# Patient Record
Sex: Female | Born: 1975 | Race: White | Hispanic: No | Smoking: Former smoker
Health system: Southern US, Community
[De-identification: ages and names within clinical notes are randomized; demographics above are authoritative.]

## PROBLEM LIST (undated history)

## (undated) DIAGNOSIS — J45909 Unspecified asthma, uncomplicated: Secondary | ICD-10-CM

## (undated) DIAGNOSIS — I10 Essential (primary) hypertension: Secondary | ICD-10-CM

---

## 2005-09-20 ENCOUNTER — Observation Stay: Payer: Self-pay | Admitting: Obstetrics and Gynecology

## 2005-10-12 ENCOUNTER — Inpatient Hospital Stay: Payer: Self-pay

## 2005-12-06 ENCOUNTER — Ambulatory Visit: Payer: Self-pay | Admitting: Obstetrics and Gynecology

## 2005-12-31 ENCOUNTER — Emergency Department: Payer: Self-pay | Admitting: Emergency Medicine

## 2006-03-06 ENCOUNTER — Ambulatory Visit: Payer: Self-pay | Admitting: Unknown Physician Specialty

## 2006-04-05 ENCOUNTER — Ambulatory Visit: Payer: Self-pay | Admitting: Unknown Physician Specialty

## 2006-05-06 ENCOUNTER — Ambulatory Visit: Payer: Self-pay | Admitting: Unknown Physician Specialty

## 2010-07-06 ENCOUNTER — Emergency Department: Payer: Self-pay | Admitting: Unknown Physician Specialty

## 2010-09-25 ENCOUNTER — Ambulatory Visit: Payer: Self-pay

## 2010-10-09 ENCOUNTER — Ambulatory Visit: Payer: Self-pay

## 2011-01-09 ENCOUNTER — Ambulatory Visit: Payer: Self-pay | Admitting: Surgery

## 2011-02-20 ENCOUNTER — Ambulatory Visit: Payer: Self-pay | Admitting: Surgery

## 2011-02-21 LAB — PATHOLOGY REPORT

## 2011-09-24 ENCOUNTER — Ambulatory Visit: Payer: Self-pay | Admitting: Physician Assistant

## 2011-09-26 ENCOUNTER — Ambulatory Visit: Payer: Self-pay | Admitting: Surgery

## 2012-10-16 ENCOUNTER — Inpatient Hospital Stay: Payer: Self-pay | Admitting: Psychiatry

## 2012-10-16 LAB — TSH: Thyroid Stimulating Horm: 1.41 u[IU]/mL

## 2012-10-16 LAB — COMPREHENSIVE METABOLIC PANEL
Albumin: 4.4 g/dL (ref 3.4–5.0)
Anion Gap: 6 — ABNORMAL LOW (ref 7–16)
BUN: 9 mg/dL (ref 7–18)
Calcium, Total: 8.9 mg/dL (ref 8.5–10.1)
Co2: 27 mmol/L (ref 21–32)
Creatinine: 0.98 mg/dL (ref 0.60–1.30)
Osmolality: 267 (ref 275–301)
Sodium: 135 mmol/L — ABNORMAL LOW (ref 136–145)

## 2012-10-16 LAB — CBC
HGB: 15.1 g/dL (ref 12.0–16.0)
MCH: 29.4 pg (ref 26.0–34.0)
MCHC: 34.3 g/dL (ref 32.0–36.0)
MCV: 86 fL (ref 80–100)
Platelet: 223 10*3/uL (ref 150–440)
RBC: 5.15 10*6/uL (ref 3.80–5.20)
RDW: 14.3 % (ref 11.5–14.5)

## 2012-10-16 LAB — DRUG SCREEN, URINE
Benzodiazepine, Ur Scrn: NEGATIVE (ref ?–200)
Cannabinoid 50 Ng, Ur ~~LOC~~: NEGATIVE (ref ?–50)
Cocaine Metabolite,Ur ~~LOC~~: NEGATIVE (ref ?–300)
MDMA (Ecstasy)Ur Screen: POSITIVE (ref ?–500)
Methadone, Ur Screen: NEGATIVE (ref ?–300)
Opiate, Ur Screen: POSITIVE (ref ?–300)

## 2012-10-16 LAB — URINALYSIS, COMPLETE
Bilirubin,UR: NEGATIVE
Glucose,UR: NEGATIVE mg/dL (ref 0–75)
Ph: 7 (ref 4.5–8.0)
Protein: NEGATIVE
Specific Gravity: 1.005 (ref 1.003–1.030)

## 2012-10-21 ENCOUNTER — Ambulatory Visit: Payer: Self-pay | Admitting: Psychiatry

## 2012-11-04 ENCOUNTER — Ambulatory Visit: Payer: Self-pay | Admitting: Psychiatry

## 2012-11-15 ENCOUNTER — Ambulatory Visit: Payer: Self-pay | Admitting: Internal Medicine

## 2014-02-12 IMAGING — CT CT ABD-PELV W/O CM
1 of 2 series · 15 of 32 positions shown, 19 images · non-contrast
Comparison: none

REASON FOR EXAM: CR [id number] severe rt flank pain  RUQ pain hematuria
COMMENTS:

PROCEDURE:     CT  - CT ABDOMEN AND PELVIS W[DATE]  [DATE]
RESULT:     Comparison: None
TECHNIQUE: Multiple axial images from the lung bases to the symphysis pubis
were obtained without oral and without intravenous contrast.

[Series 2: 3mm soft tissue · axial · 0.68mm/px · z∈[-587,-167]mm · 15 of 154 slices shown, 19 images]
[im 7/154  soft-tissue]
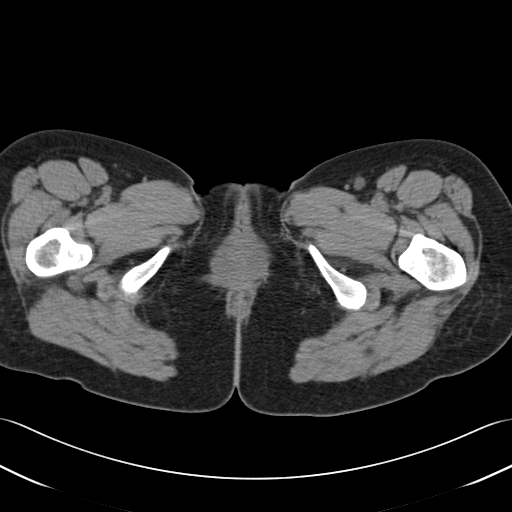
[im 7/154  bone]
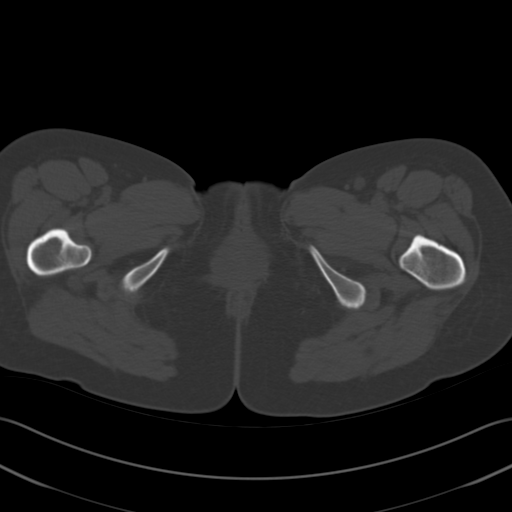
[im 20/154  soft-tissue]
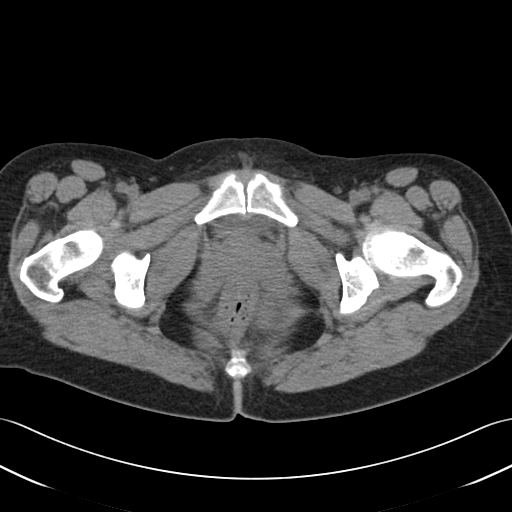
[im 34/154  soft-tissue]
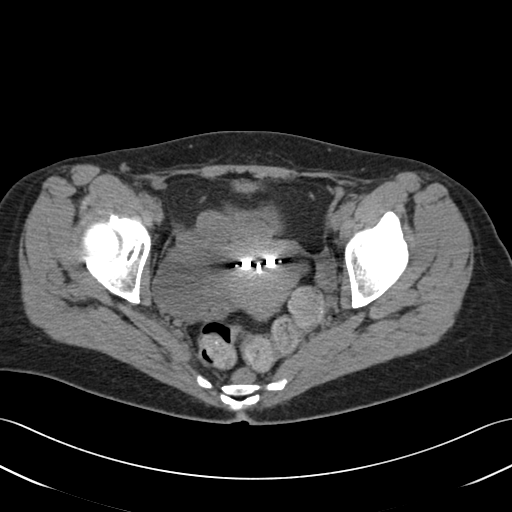
[im 40/154  soft-tissue]
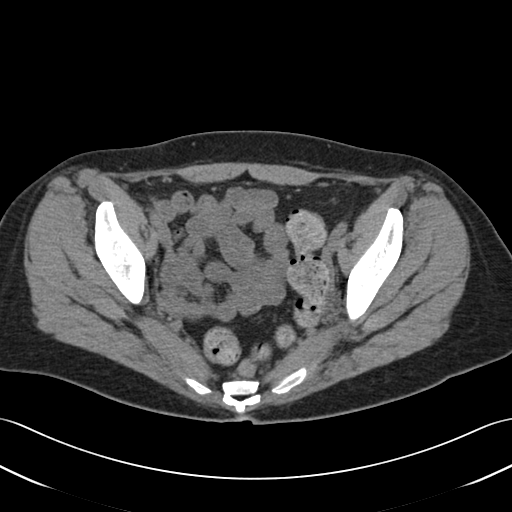
[im 54/154  soft-tissue]
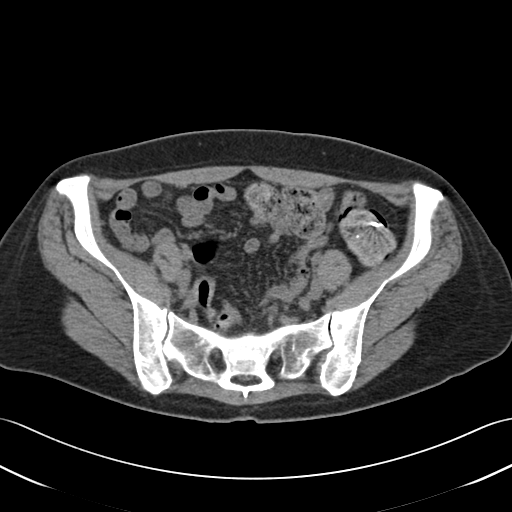
[im 67/154  soft-tissue]
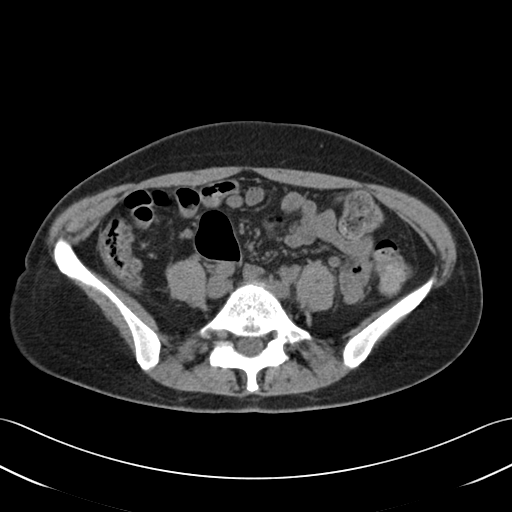
[im 80/154  soft-tissue]
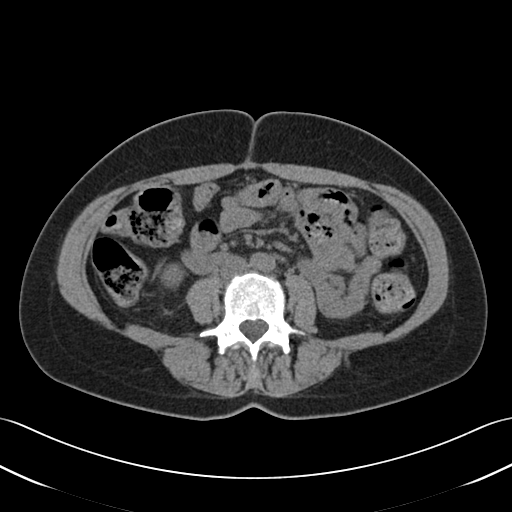
[im 87/154  soft-tissue]
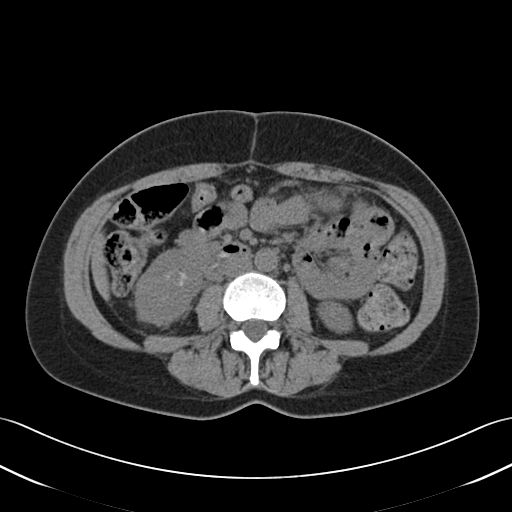
[im 100/154  soft-tissue]
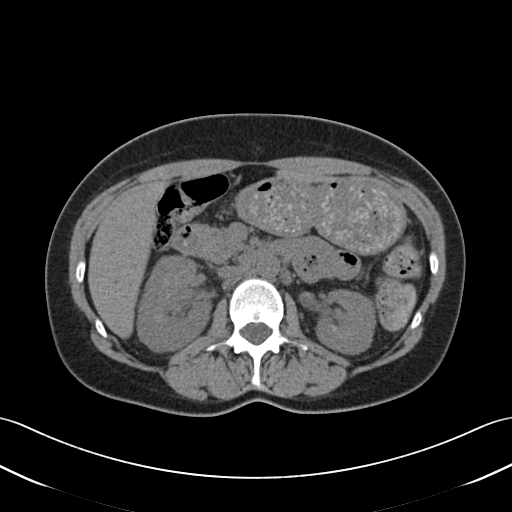
[im 100/154  bone]
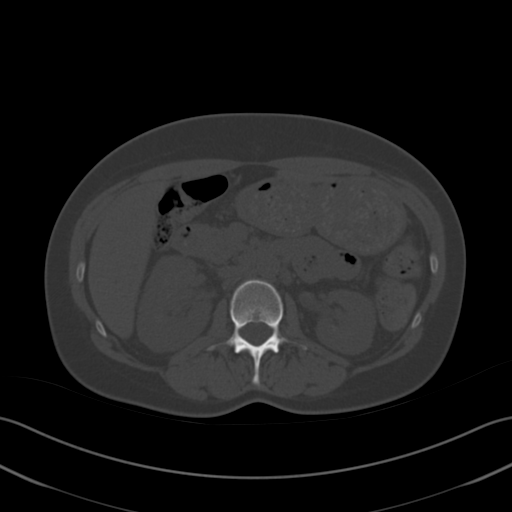
[im 114/154  soft-tissue]
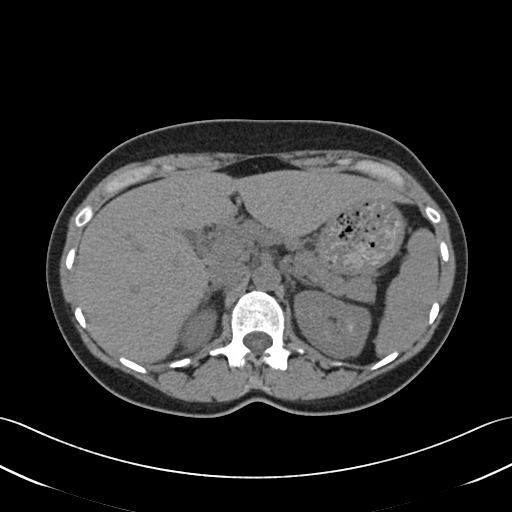
[im 120/154  soft-tissue]
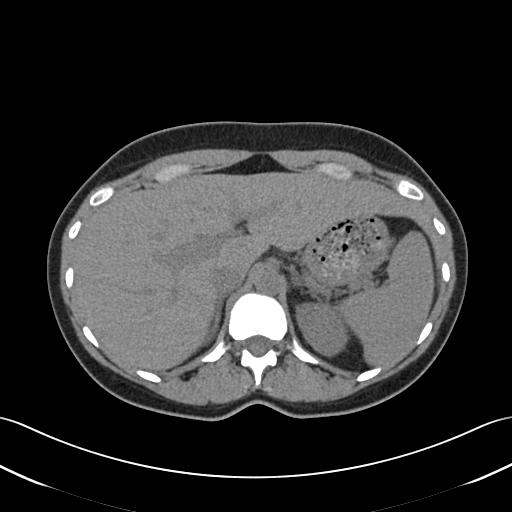
[im 127/154  lung]
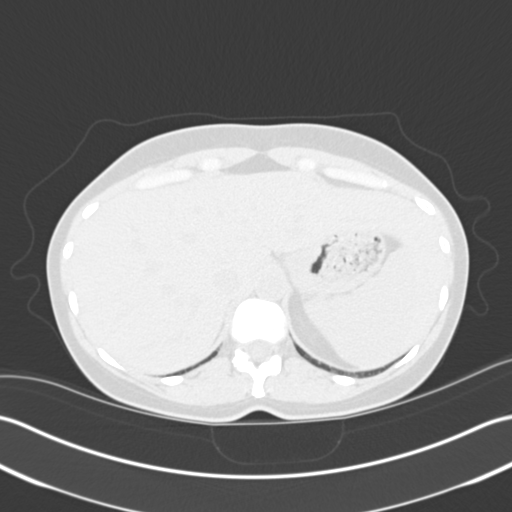
[im 134/154  soft-tissue]
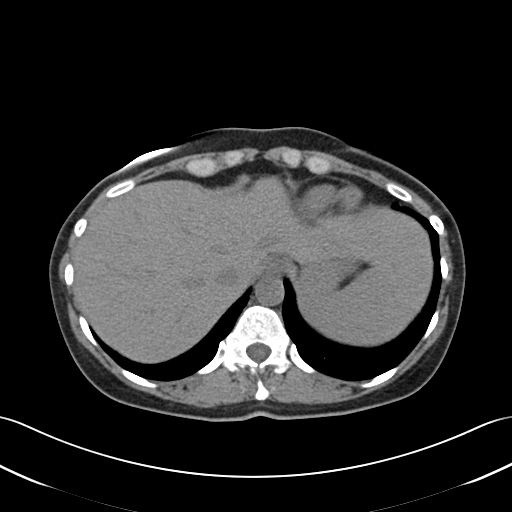
[im 134/154  lung]
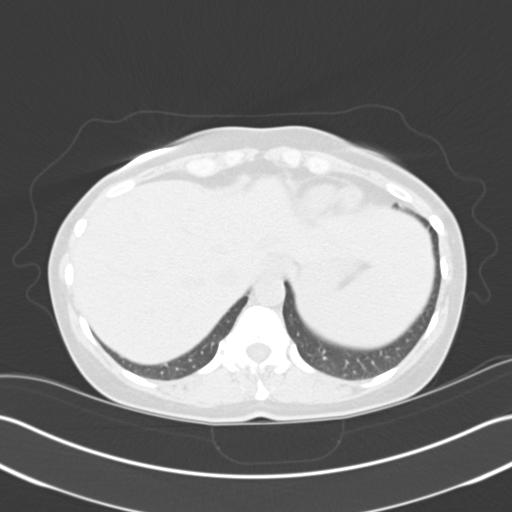
[im 140/154  lung]
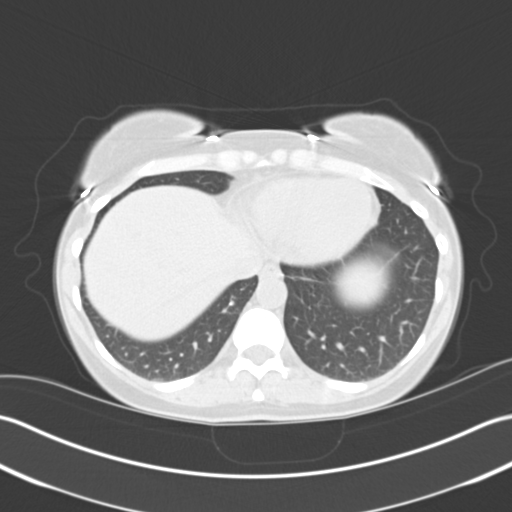
[im 147/154  soft-tissue]
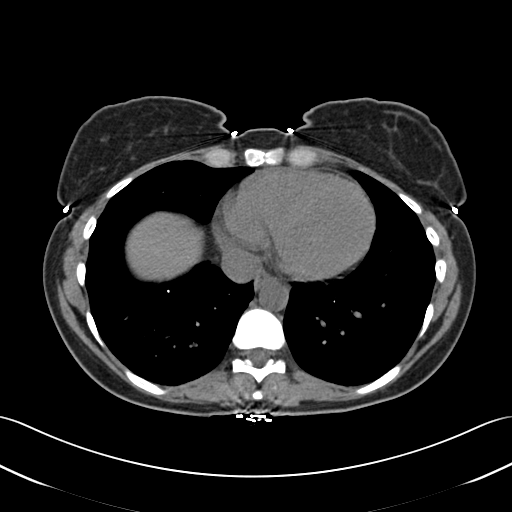
[im 147/154  lung]
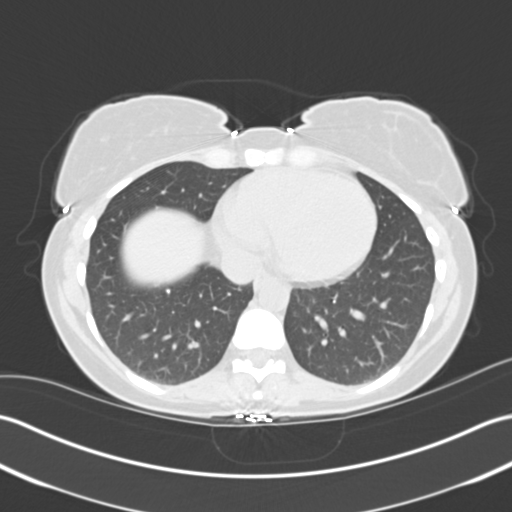

[15 of 32 positions shown; findings below may reference images not displayed]

FINDINGS: There are multiple calcified subcentimeter nodules in the lower lobes,
likely sequela of old prior infection.

Lack of intravenous contrast limits evaluation of the solid abdominal
organs.  Grossly, the liver, spleen, adrenals, and pancreas are
unremarkable. The gallbladder is relatively decompressed.

There are ill-defined calcific densities in the bilateral renal medullary
pyramids, as can be seen with medullary nephrocalcinosis. No hydronephrosis
or ureterectasis. There are a few 1-2 mm calculi in the bilateral kidneys.
There is minimal perinephric stranding, which is nonspecific.

The small and large bowel are normal in caliber. There is a small
fat-containing periumbilical hernia. An intrauterine contraceptive device is
present. There is a cystic mass in the right adnexa which is likely ovarian.
It measures 4.3 x 3.9 cm. The appendix is not definitely visualized.
However, there are no inflammatory changes at the base of the cecum.

No aggressive lytic or sclerotic osseous lesions are identified.
IMPRESSION: 1. Bilateral nephrolithiasis in addition to findings which may represent
medullary nephrocalcinosis. No evidence of renal obstruction.
2. There is a 4.3 cm right ovarian cyst. Given its size, followup pelvic
ultrasound is recommended in 6 weeks to ensure resolution.

## 2014-05-27 NOTE — Discharge Summary (Signed)
PATIENT NAME:  Caitlin Nicholson, Caitlin Nicholson MR#:  [id number] DATE OF BIRTH:  [birth year]  DATE OF ADMISSION:  10/16/2012 DATE OF DISCHARGE:   10/20/2012  HOSPITAL COURSE:  See dictated history and physical for details of admission. This is a 39 year old woman with a history of opiate dependence, who was admitted to the hospital for detox. She was treated with methadone taper over 3 days. I used methadone because she has a history of a bad reaction, possibly an allergy, to Suboxone in the past. The patient tolerated this fine with minimal to no withdrawal symptoms. She engaged appropriately in group therapy and individual therapy. She showed good insight and judgment. She denied any suicidal ideation and did not engage in any dangerous behavior. The patient is now detoxed and physically stable. She has a plan to follow up with the intensive outpatient program here at our facility, which she has worked with before. She also has an outpatient psychiatrist she has started seeing for her anxiety and depression.  At this point, the patient appears to be stable. No sign of acute dangerousness and agreeable to the discharge plan.   DISCHARGE MEDICATIONS:  Pristiq 50 mg p.o. daily, Maxalt 10 mg daily p.r.n. for pain.   LABORATORY RESULTS:  Drug screen positive for MDMA and opiates. Potassium 3.7. URINALYSIS: 1+ blood, otherwise unremarkable. Alcohol undetected. TSH normal. CHEMISTRY PANEL: Low sodium at 135, low potassium 3.1. White count slightly elevated at 11.7.   MENTAL STATUS EXAMINATION AT DISCHARGE:  Neatly dressed and groomed woman, looks her stated age, agreeable to the interview. Good eye contact. Normal psychomotor activity. Speech normal rate, tone and volume. Affect euthymic, reactive, appropriate. Mood stated as being good. Thoughts are lucid without any loosening of associations or delusions. Denies auditory or visual hallucinations. Denies suicidal or homicidal ideation. Shows decent insight and judgment. Normal  intelligence. Alert and oriented x 4.   DIAGNOSIS, PRINCIPAL AND PRIMARY:  AXIS I:  Opiate dependence.   SECONDARY DIAGNOSES: AXIS I:  Depression, not otherwise specified.  AXIS II:  No diagnosis.  AXIS III:  Migraine headaches.  AXIS IV:  Moderate from ongoing stress from her condition.  AXIS V:  Functioning at time of discharge is 60.   ____________________________ Audery AmelJohn Nicholson. Tyshea Imel, MD jtc:dmm D: 10/20/2012 11:34:32 ET Nicholson: 10/20/2012 12:36:55 ET JOB#: [id number]  cc: Audery AmelJohn Nicholson. Lexii Walsh, MD, <Dictator> Audery AmelJOHN Nicholson Enma Maeda MD ELECTRONICALLY SIGNED 10/21/2012 11:50

## 2014-05-27 NOTE — Consult Note (Signed)
Consult: treatment recommendations Patient was seen as requested by Clinical Child psychotherapistocial Worker. She was able to express desire [facility] at this inpatient discharge. She admitted to abuse of opiates as well as marijuana with her present goal of complete abstinence of all addictive substances as well as positive behavioral changes. She believes that she could benefit from CD-IOP. Informational handout given to patient.  are for patient to be admitted into the CD-IOP post this inpatient Behavioral Medicine treatment... She was scheduled to complete CD-IOP assessment October 21, 2012 at [facility].   nurse informed of patient desires.    Electronic Signatures: Huel Cotehomas, Richard (PsyD) (Signed on 16-Sep-14 14:05)  Authored   Last Updated: 16-Sep-14 14:06 by Huel Cotehomas, Richard (PsyD)

## 2014-05-27 NOTE — H&P (Signed)
PATIENT NAME:  Caitlin Nicholson, [name] MR#:  [id number] DATE OF BIRTH:  [birth year]  DATE OF ADMISSION:  10/16/2012  CONSULTING PHYSICIAN:  Audery AmelJohn T. Bretton Tandy, MD  IDENTIFYING INFORMATION AND CHIEF COMPLAINT: This is a 39 year old woman who presented to the Emergency Room with the chief complaint, "I need detox."  HISTORY OF PRESENT ILLNESS: Information obtained from the patient and the chart. The patient presented requesting detox from Vicodin. She says she has been abusing Vicodin on and off since about 2008. She finds it very difficult to go through the withdrawal at home. She has decided that she is fed up with it and has recently started seeing a new psychiatrist. She wants to get off the Vicodin and feels like coming into the hospital would be the best way to achieve it. She estimates that she is most recently taking about 100 mg a day of hydrocodone equivalent. She denies that she is abusing any other drugs. Her mood does stay anxious and depressed at times. She feels fatigued. Feels down on herself. Some sleeping difficulty. She denies any suicidal or homicidal ideation. Denies any psychotic symptoms.   PAST PSYCHIATRIC HISTORY: Patient is currently seeing a substance abuse counselor and has been for some time. Additionally, she just started seeing Dr. Lucianne MussLima in our office for treatment of depression and anxiety symptoms. She just recently started taking Pristiq 50 mg per day. She denies any history of suicide attempts. No history of psychiatric hospitalization. No history of psychosis.   PAST MEDICAL HISTORY: The patient has chronic migraines. She indicates that these were the origin of her abuse of narcotics when she was first given opiates for migraines and then went on to convince doctors to continue prescribing them at high doses for her. Otherwise, she is fairly healthy. She has 2 daughters whom she shares partial custody of.   SOCIAL HISTORY: The patient lives by herself after the time with her two  daughters in her partial custody half of the time. She is employed. She is not currently in any kind of relationship. She does have her family of origin locally and has a good relationship with them.   REVIEW OF SYSTEMS:  Complains of feeling achy and sick to her stomach. Tired. Mood is mildly depressed and anxious. Denies any psychotic symptoms. Denies suicidal or homicidal ideation.   MENTAL STATUS EXAMINATION: Neatly groomed woman interviewed in the Emergency Room. Appropriate interaction. Decreased eye contact. Psychomotor activity a bit sluggish. Speech easy to understand, but quiet. Affect a little blunted. Mood is stated as being tired. Lucid without any signs of loosening of associations or delusions. She denies hallucinations. She denies suicidal or homicidal ideation. Judgment and insight are good. Intelligence normal. Alert and oriented x 4.   PHYSICAL EXAMINATION: GENERAL: Healthy-appearing woman who looks her stated age. No skin lesions identified.  HEENT: Pupils equal and reactive. Face symmetric.  NECK AND BACK: Nontender.  EXTREMITIES:  Normal gait. Full range of motion at all extremities. Strength and reflexes are normal and symmetric throughout.  NEUROLOGICAL: Cranial nerves are symmetric and normal.  LUNGS: Clear without any wheezes.  HEART: Has regular rate and rhythm. No extra sounds.  ABDOMEN: Soft, nontender, normal bowel sounds.  VITAL SIGNS: Her most recent vital signs include a temperature of 98.8, pulse 105, respirations 20, blood pressure 168/91.   CURRENT MEDICATIONS: Pristiq 50 mg per day. She also typically takes Maxalt 10 mg p.r.n. for migraines. She indicates that her psychiatrist told her that she could not take the  Maxalt while taking Pristiq, so she has not been taking it recently. Not taking any other prescribed medicines.   ALLERGIES: AMOXICILLIN, PENICILLIN AND SULFA DRUGS ARE LISTED, BUT THE PATIENT ALSO DESCRIBES TO ME AN ALLERGY TO SUBOXONE. She says she  was  prescribed it some months ago in an attempt to help her with her substance abuse problem, and she developed a rash from it.   ASSESSMENT: This is a 39 year old woman with opiate dependence. Some depressive symptoms, but not a major depressive episode necessarily. She is currently requesting detox, because of the difficulty she has had getting off of opiates on her own outside the hospital. I think admission to the hospital for a brief period for detox is warranted.   TREATMENT PLAN: Because of her bad reaction to Suboxone, we agreed that we will use methadone for a couple of days. According to the tables, I could find at a dose of 15 mg of methadone a day would be roughly equivalent to what she has been taking. We will use that for a couple of days and then cut it down to 5 mg a day before stopping it. Meanwhile, I will write orders for clonidine, Robaxin, Imodium and Zofran for p.r.n. use. The patient was educated about the process of opiate withdrawal. She is encouraged to try and stay well hydrated. She is encouraged to talk with the nursing staff about any change in her symptoms. I will continue her Pristiq. I also put in an order for the p.r.n. Maxalt. I explained to her that there is a potential reaction between serotonin reuptake inhibitors and triptans, but that it is rarely of great clinical significance and if the triptans had been very helpful for her, it might be reasonable to not rule them out completely.   DIAGNOSIS, PRINCIPAL AND PRIMARY: AXIS I: Opiate dependence.   SECONDARY DIAGNOSES: AXIS I: Deferred.   AXIS II: No diagnosis.   AXIS III: Migraine headaches.   AXIS IV:  Moderate from stress of her chronic substance use.   AXIS V: Functioning at time of evaluation, 40.    ____________________________ Audery Amel, MD jtc:nts D: 10/16/2012 23:23:42 ET T: 10/16/2012 23:43:32 ET JOB#: [id number]  cc:   Audery Amel MD ELECTRONICALLY SIGNED 10/17/2012 0:27

## 2014-06-14 ENCOUNTER — Encounter: Payer: Self-pay | Admitting: *Deleted

## 2014-06-14 ENCOUNTER — Emergency Department
Admission: EM | Admit: 2014-06-14 | Discharge: 2014-06-14 | Disposition: A | Discharge status: Home / Self Care | Payer: Self-pay | Attending: Emergency Medicine | Admitting: Emergency Medicine

## 2014-06-14 ENCOUNTER — Other Ambulatory Visit: Payer: Self-pay

## 2014-06-14 ENCOUNTER — Emergency Department: Payer: Self-pay

## 2014-06-14 DIAGNOSIS — Z79899 Other long term (current) drug therapy: Secondary | ICD-10-CM | POA: Insufficient documentation

## 2014-06-14 DIAGNOSIS — J45901 Unspecified asthma with (acute) exacerbation: Secondary | ICD-10-CM | POA: Insufficient documentation

## 2014-06-14 DIAGNOSIS — I10 Essential (primary) hypertension: Secondary | ICD-10-CM | POA: Insufficient documentation

## 2014-06-14 DIAGNOSIS — Z87891 Personal history of nicotine dependence: Secondary | ICD-10-CM | POA: Insufficient documentation

## 2014-06-14 DIAGNOSIS — Z7952 Long term (current) use of systemic steroids: Secondary | ICD-10-CM | POA: Insufficient documentation

## 2014-06-14 DIAGNOSIS — J9801 Acute bronchospasm: Secondary | ICD-10-CM

## 2014-06-14 HISTORY — DX: Unspecified asthma, uncomplicated: J45.909

## 2014-06-14 HISTORY — DX: Essential (primary) hypertension: I10

## 2014-06-14 MED ORDER — PSEUDOEPH-BROMPHEN-DM 30-2-10 MG/5ML PO SYRP: 5.0000 mL | ORAL_SOLUTION | Freq: Four times a day (QID) | ORAL | Status: DC | PRN

## 2014-06-14 MED ORDER — PREDNISONE 10 MG (21) PO TBPK: 10.0000 mg | ORAL_TABLET | Freq: Every day | ORAL | Status: DC

## 2014-06-14 NOTE — ED Notes (Signed)
States feels light headed

## 2014-06-14 NOTE — ED Provider Notes (Addendum)
[facility] Emergency Department Provider Note  ____________________________________________  Time seen: Approximately 1:55 PM  I have reviewed the triage vital signs and the nursing notes.   HISTORY  Chief Complaint Shortness of Breath     HPI Caitlin Nicholson is a 39 y.o. female complaining of least 1 week of dyspnea and nonproductive cough. Patient saw her PCP last week and was given a Z-Pak for 5 days which has not helped. Patient states she was diagnosed with pneumonia. Patient stated this upper chest discomfort secondary to the cough which he rates as a 4/10. States she is also using an inhaler she's had in her possession for over a year.  Past Medical History  Diagnosis Date  . Asthma   . Hypertension     There are no active problems to display for this patient.   History reviewed. No pertinent past surgical history.  Current Outpatient Rx  Name  Route  Sig  Dispense  Refill  . albuterol (PROVENTIL HFA;VENTOLIN HFA) 108 (90 BASE) MCG/ACT inhaler   Inhalation   Inhale 2 puffs into the lungs every 4 (four) hours as needed for wheezing or shortness of breath (using q every hour).         Marland Kitchen. albuterol (PROVENTIL) (2.5 MG/3ML) 0.083% nebulizer solution   Nebulization   Take 2.5 mg by nebulization every 6 (six) hours as needed for wheezing or shortness of breath.         . traZODone (DESYREL) 100 MG tablet   Oral   Take 100 mg by mouth at bedtime.         Marland Kitchen. venlafaxine XR (EFFEXOR-XR) 150 MG 24 hr capsule   Oral   Take 150 mg by mouth daily with breakfast.         . verapamil (CALAN-SR) 180 MG CR tablet   Oral   Take 360 mg by mouth at bedtime.         . brompheniramine-pseudoephedrine-DM 30-2-10 MG/5ML syrup   Oral   Take 5 mLs by mouth 4 (four) times daily as needed.   120 mL   0   . predniSONE (STERAPRED UNI-PAK 21 TAB) 10 MG (21) TBPK tablet   Oral   Take 1 tablet (10 mg total) by mouth daily. Taper over 6 days.   21  tablet   0     Allergies Sulfa antibiotics  No family history on file.  Social History History  Substance Use Topics  . Smoking status: Former Games developermoker  . Smokeless tobacco: Not on file  . Alcohol Use: Yes    Review of Systems Constitutional: No fever/chills Eyes: No visual changes. ENT: No sore throat. Cardiovascular: Denies chest pain. Respiratory: Mild wheezing and dyspnea.  Gastrointestinal: No abdominal pain.  No nausea, no vomiting.  No diarrhea.  No constipation. Genitourinary: Negative for dysuria. Musculoskeletal: Negative for back pain. Skin: Negative for rash. Neurological: Negative for headaches, focal weakness or numbness. Psychiatric:Negative Endocrine:Negative Hematological/Lymphatic:Negative Allergic/Immunilogical:  10-point ROS otherwise negative.  ____________________________________________   PHYSICAL EXAM:  VITAL SIGNS: ED Triage Vitals  Enc Vitals Group     BP 06/14/14 1234 111/75 mmHg     Pulse Rate 06/14/14 1234 80     Resp --      Temp 06/14/14 1234 98.1 F (36.7 C)     Temp Source 06/14/14 1234 Oral     SpO2 06/14/14 1234 100 %     Weight 06/14/14 1234 160 lb (72.576 kg)     Height 06/14/14  1234 5\' 5"  (1.651 m)     Head Cir --      Peak Flow --      Pain Score 06/14/14 1234 4     Pain Loc --      Pain Edu? --      Excl. in GC? --     Constitutional: Alert and oriented. Well appearing and in no acute distress. Eyes: Conjunctivae are normal. PERRL. EOMI. Head: Atraumatic. Nose: No congestion/rhinnorhea. Mouth/Throat: Mucous membranes are moist.  Oropharynx non-erythematous. Neck: No stridor.   Hematological/Lymphatic/Immunilogical: No cervical lymphadenopathy. Cardiovascular: Normal rate, regular rhythm. Grossly normal heart sounds.  Good peripheral circulation. Respiratory: Normal respiratory effort.  No retractions. Lungs CTAB. Nonproductive cough which increases inspiration. Gastrointestinal: Soft and nontender. No  distention. No abdominal bruits. No CVA tenderness. Genitourinary: Not examined Musculoskeletal: No lower extremity tenderness nor edema.  No joint effusions. Neurologic:  Normal speech and language. No gross focal neurologic deficits are appreciated. Speech is normal. No gait instability. Skin:  Skin is warm, dry and intact. No rash noted. Psychiatric: Mood and affect are normal. Speech and behavior are normal.  ____________________________________________   LABS (all labs ordered are listed, but only abnormal results are displayed)  Labs Reviewed - No data to display ____________________________________________  EKG   ____________________________________________  RADIOLOGY  Chronic bronchial changes ____________________________________________   PROCEDURES  Procedure(s) performed: None  Critical Care performed: No  ____________________________________________   INITIAL IMPRESSION / ASSESSMENT AND PLAN / ED COURSE  Pertinent labs & imaging results that were available during my care of the patient were reviewed by me and considered in my medical decision making (see chart for details).  bronchitis ____________________________________________   FINAL CLINICAL IMPRESSION(S) / ED DIAGNOSES  Final diagnoses:  Bronchospasm, acute      Joni ReiningRonald K Smith, PA-C 06/14/14 1510  Sharman CheekPhillip Miracle Criado, MD 06/15/14 0719  ----------------------------------------- 4:20 PM on 06/22/2014 -----------------------------------------  Late entry into medical record. EKG interpretation from initial visit is as follows:  Date: 06/22/2014  Rate: 76  Rhythm: normal sinus rhythm  QRS Axis: normal  Intervals: normal  ST/T Wave abnormalities: normal  Conduction Disutrbances: none  Narrative Interpretation: unremarkable      Sharman CheekPhillip Jameia Makris, MD 06/22/14 1621

## 2014-06-14 NOTE — ED Notes (Signed)
Started on antibiotic last week for pneumonia, still having sob/ cough

## 2014-06-14 NOTE — Discharge Instructions (Signed)
Bronchospasm °A bronchospasm is a spasm or tightening of the airways going into the lungs. During a bronchospasm breathing becomes more difficult because the airways get smaller. When this happens there can be coughing, a whistling sound when breathing (wheezing), and difficulty breathing. Bronchospasm is often associated with asthma, but not all patients who experience a bronchospasm have asthma. °CAUSES  °A bronchospasm is caused by inflammation or irritation of the airways. The inflammation or irritation may be triggered by:  °· Allergies (such as to animals, pollen, food, or mold). Allergens that cause bronchospasm may cause wheezing immediately after exposure or many hours later.   °· Infection. Viral infections are believed to be the most common cause of bronchospasm.   °· Exercise.   °· Irritants (such as pollution, cigarette smoke, strong odors, aerosol sprays, and paint fumes).   °· Weather changes. Winds increase molds and pollens in the air. Rain refreshes the air by washing irritants out. Cold air may cause inflammation.   °· Stress and emotional upset.   °SIGNS AND SYMPTOMS  °· Wheezing.   °· Excessive nighttime coughing.   °· Frequent or severe coughing with a simple cold.   °· Chest tightness.   °· Shortness of breath.   °DIAGNOSIS  °Bronchospasm is usually diagnosed through a history and physical exam. Tests, such as chest X-rays, are sometimes done to look for other conditions. °TREATMENT  °· Inhaled medicines can be given to open up your airways and help you breathe. The medicines can be given using either an inhaler or a nebulizer machine. °· Corticosteroid medicines may be given for severe bronchospasm, usually when it is associated with asthma. °HOME CARE INSTRUCTIONS  °· Always have a plan prepared for seeking medical care. Know when to call your health care provider and local emergency services (911 in the [place] Know where you can access local emergency care. °· Only take medicines as  directed by your health care provider. °· If you were prescribed an inhaler or nebulizer machine, ask your health care provider to explain how to use it correctly. Always use a spacer with your inhaler if you were given one. °· It is necessary to remain calm during an attack. Try to relax and breathe more slowly.  °· Control your home environment in the following ways:   °¨ Change your heating and air conditioning filter at least once a month.   °¨ Limit your use of fireplaces and wood stoves. °¨ Do not smoke and do not allow smoking in your home.   °¨ Avoid exposure to perfumes and fragrances.   °¨ Get rid of pests (such as roaches and mice) and their droppings.   °¨ Throw away plants if you see mold on them.   °¨ Keep your house clean and dust free.   °¨ Replace carpet with wood, tile, or vinyl flooring. Carpet can trap dander and dust.   °¨ Use allergy-proof pillows, mattress covers, and box spring covers.   °¨ Wash bed sheets and blankets every week in hot water and dry them in a dryer.   °¨ Use blankets that are made of polyester or cotton.   °¨ Wash hands frequently. °SEEK MEDICAL CARE IF:  °· You have muscle aches.   °· You have chest pain.   °· The sputum changes from clear or white to yellow, green, gray, or bloody.   °· The sputum you cough up gets thicker.   °· There are problems that may be related to the medicine you are given, such as a rash, itching, swelling, or trouble breathing.   °SEEK IMMEDIATE MEDICAL CARE IF:  °· You have worsening wheezing and coughing even   after taking your prescribed medicines.   °· You have increased difficulty breathing.   °· You develop severe chest pain. °MAKE SURE YOU:  °· Understand these instructions. °· Will watch your condition. °· Will get help right away if you are not doing well or get worse. °Document Released: 01/24/2003 Document Revised: 01/26/2013 Document Reviewed: 07/13/2012 °[employer]® Patient Information ©2015 [employer]. This information is not  intended to replace advice given to you by your health care provider. Make sure you discuss any questions you have with your health care provider. ° °

## 2016-11-02 IMAGING — CR DG CHEST 2V
1 series · 2 of 2 positions shown · non-contrast
Comparison: None.

CLINICAL DATA: 39-year-old female with diagnosis of pneumonia last
week. Shortness of Breath increasing since yesterday. Hypertension.
History of smoking. Initial encounter.

EXAM:
CHEST  2 VIEW

[Series 1: dg chest 2 view · 0.14mm/px · 2 of 2 slices shown]
[im 1/2]
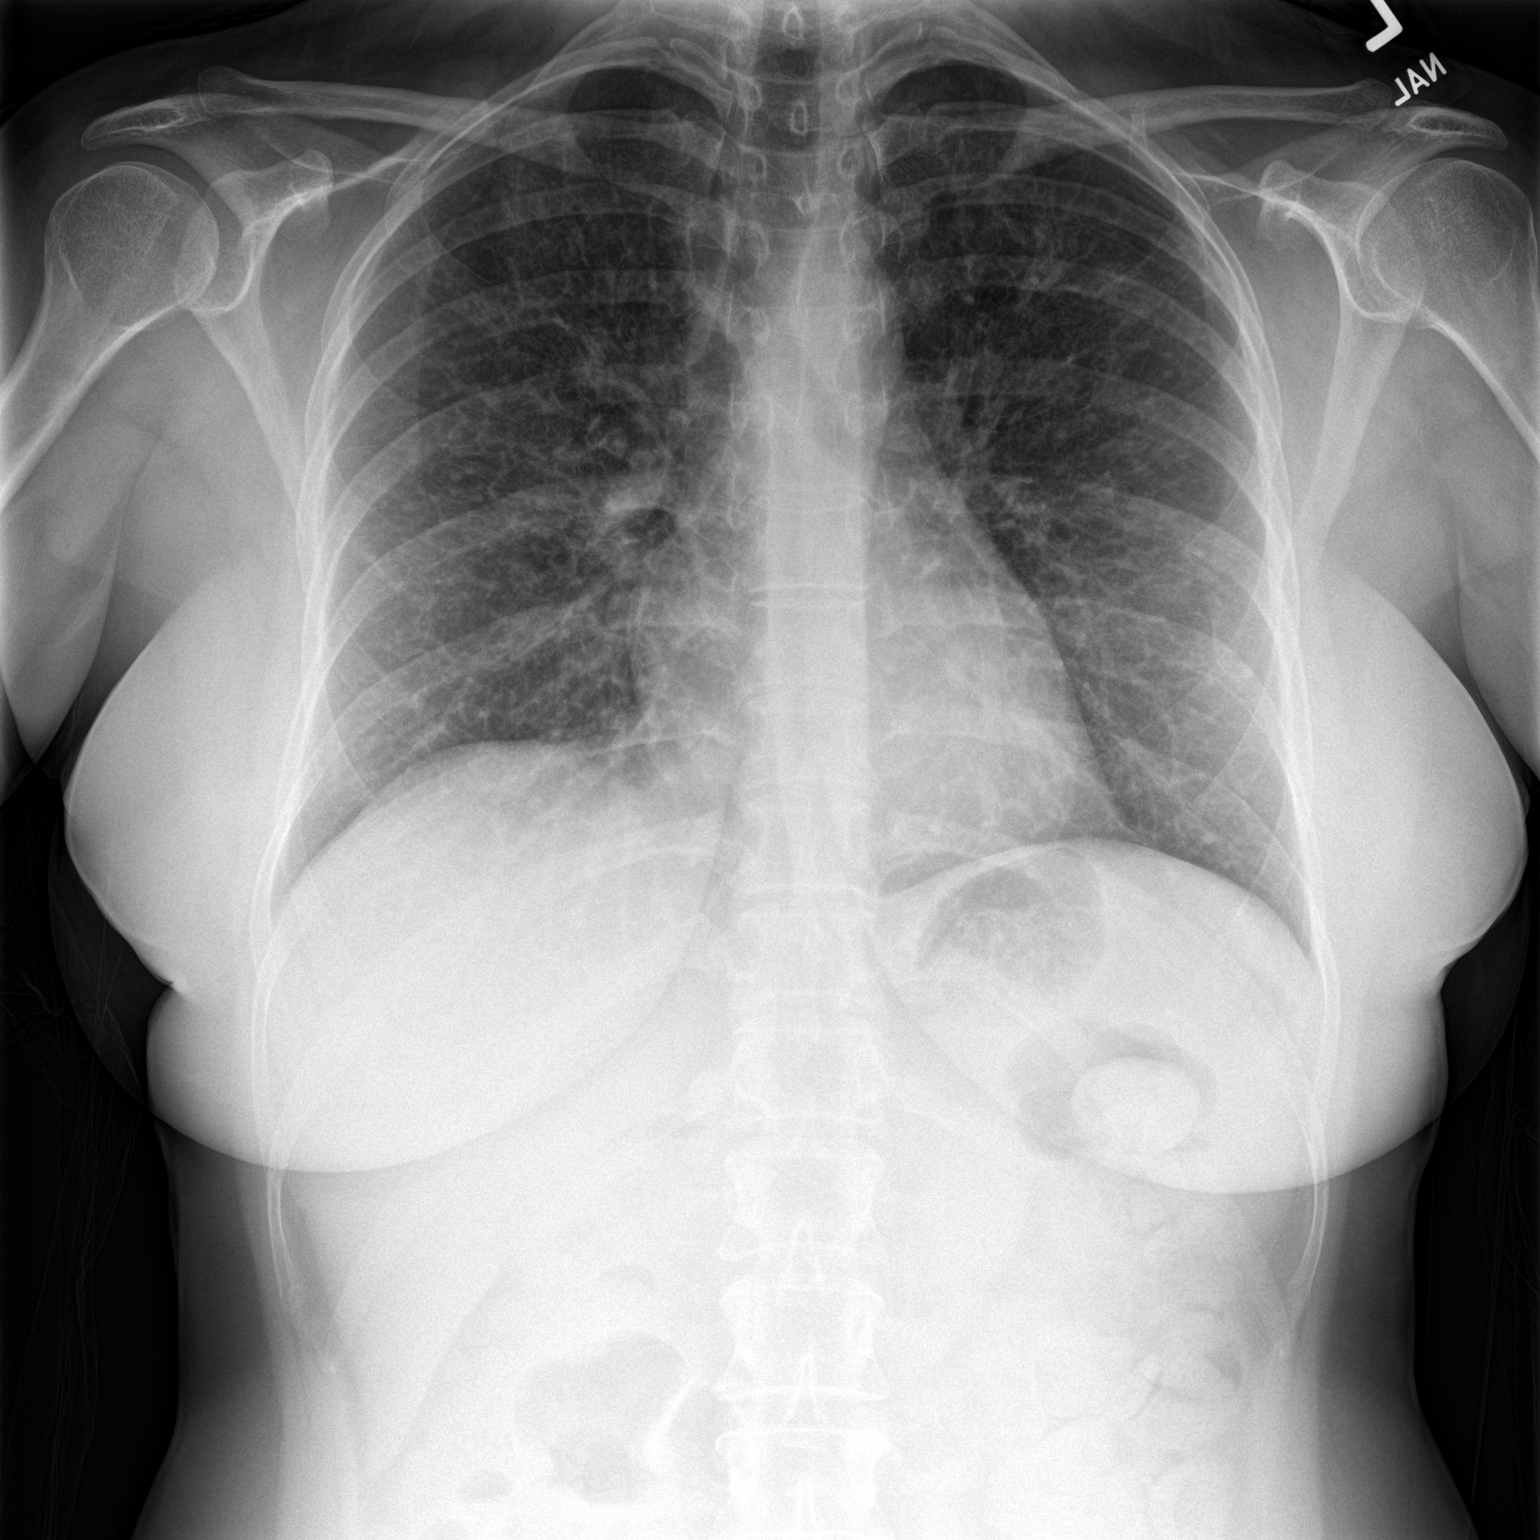
[im 2/2]
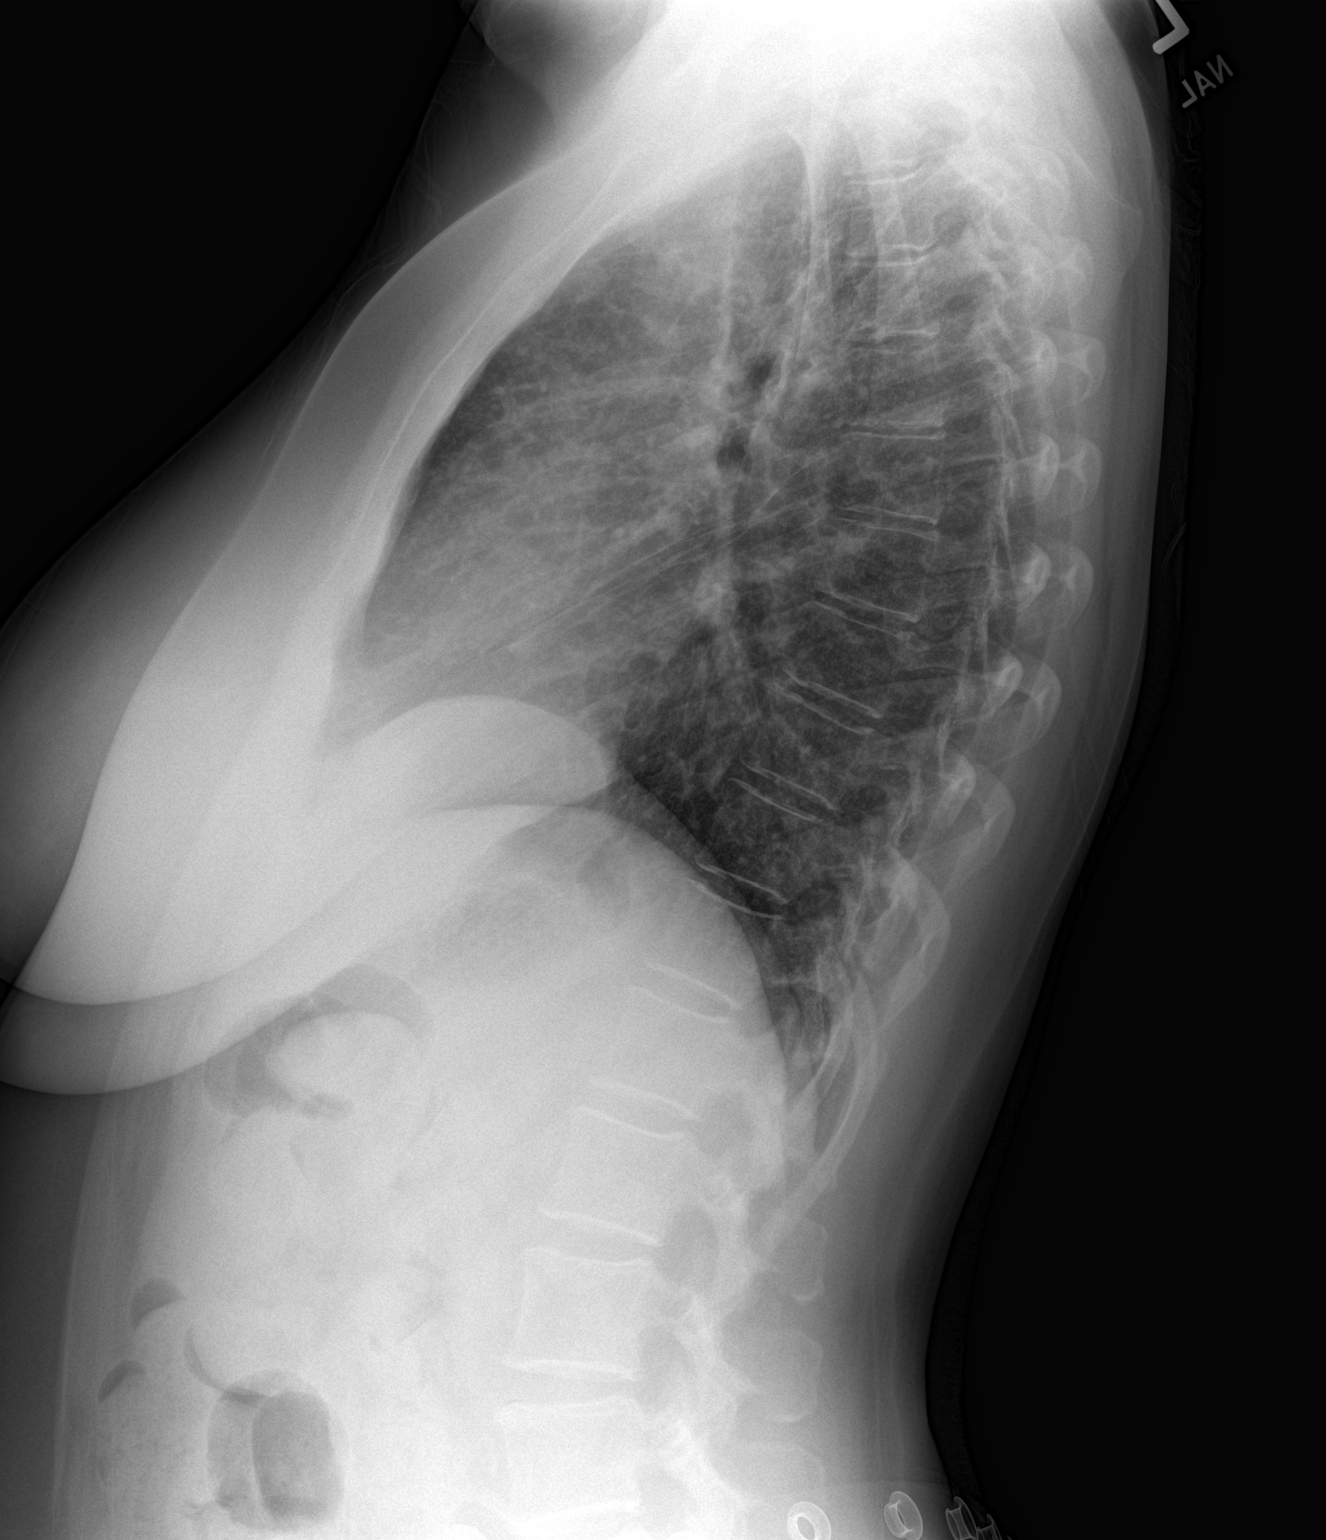

[2 of 2 positions shown; findings below may reference images not displayed]

FINDINGS: Diffuse increased lung markings have an appearance suggestive of
chronic changes although cannot be confirmed without prior exams.

No segmental consolidation, pneumothorax or pulmonary edema.

No plain film evidence of pulmonary malignancy.

Heart size within normal limits.
IMPRESSION: Diffuse increased lung markings suggestive of chronic changes as
detailed above.

## 2019-01-11 ENCOUNTER — Other Ambulatory Visit: Payer: Self-pay

## 2019-01-11 DIAGNOSIS — Z20822 Contact with and (suspected) exposure to covid-19: Secondary | ICD-10-CM

## 2019-01-13 LAB — NOVEL CORONAVIRUS, NAA: SARS-CoV-2, NAA: NOT DETECTED

## 2022-04-09 ENCOUNTER — Ambulatory Visit
Admission: RE | Admit: 2022-04-09 | Discharge: 2022-04-09 | Disposition: A | Discharge status: Home / Self Care | Payer: No Typology Code available for payment source | Attending: Family Medicine | Admitting: Family Medicine

## 2022-04-09 ENCOUNTER — Other Ambulatory Visit: Payer: Self-pay | Admitting: Family Medicine

## 2022-04-09 ENCOUNTER — Ambulatory Visit
Admission: RE | Admit: 2022-04-09 | Discharge: 2022-04-09 | Disposition: A | Discharge status: Home / Self Care | Payer: No Typology Code available for payment source | Source: Ambulatory Visit | Attending: Family Medicine | Admitting: Family Medicine

## 2022-04-09 DIAGNOSIS — S6991XA Unspecified injury of right wrist, hand and finger(s), initial encounter: Secondary | ICD-10-CM

## 2022-04-09 DIAGNOSIS — S99921A Unspecified injury of right foot, initial encounter: Secondary | ICD-10-CM

## 2023-05-11 ENCOUNTER — Ambulatory Visit
Admission: EM | Admit: 2023-05-11 | Discharge: 2023-05-11 | Disposition: A | Discharge status: Home / Self Care | Source: Home / Self Care

## 2023-05-11 ENCOUNTER — Encounter: Payer: Self-pay | Admitting: Emergency Medicine

## 2023-05-11 DIAGNOSIS — R0602 Shortness of breath: Secondary | ICD-10-CM | POA: Diagnosis not present

## 2023-05-11 DIAGNOSIS — U071 COVID-19: Secondary | ICD-10-CM

## 2023-05-11 DIAGNOSIS — A4189 Other specified sepsis: Secondary | ICD-10-CM | POA: Diagnosis not present

## 2023-05-11 LAB — RESP PANEL BY RT-PCR (FLU A&B, COVID) ARPGX2
Influenza A by PCR: NEGATIVE
Influenza B by PCR: NEGATIVE
SARS Coronavirus 2 by RT PCR: POSITIVE — AB

## 2023-05-11 MED ORDER — MOLNUPIRAVIR EUA 200MG CAPSULE: 4.0000 | ORAL_CAPSULE | Freq: Two times a day (BID) | ORAL | 0 refills | Status: DC

## 2023-05-11 NOTE — ED Provider Notes (Addendum)
 [facility]    CSN: [id number] Arrival date & time: 05/11/23  0945      History   Chief Complaint Chief Complaint  Patient presents with   Cough   Generalized Body Aches    HPI Caitlin Nicholson is a 48 y.o. female  presents for evaluation of URI symptoms for 3 days. Patient reports associated symptoms of cough, congestion, headache, sore throat, body aches, posttussive vomiting, ear pain, shortness of breath. Denies diarrhea, fevers. Patient does have a hx of asthma.  Has an albuterol inhaler but has not needed to use since symptoms began.  Patient is not an active smoker.   Reports no home sick contacts.  Pt has taken cold medicine OTC for symptoms. Pt has no other concerns at this time.    Cough Associated symptoms: ear pain, headaches, myalgias, shortness of breath and sore throat     Past Medical History:  Diagnosis Date   Asthma    Hypertension     There are no active problems to display for this patient.   History reviewed. No pertinent surgical history.  OB History   No obstetric history on file.      Home Medications    Prior to Admission medications   Medication Sig Start Date End Date Taking? Authorizing Provider  buprenorphine-naloxone (SUBOXONE) 8-2 mg SUBL SL tablet BUPRENORPHINE HCL-NALOXONE HCL 8-2 MG SUBL 03/03/23  Yes [provider]  molnupiravir EUA (LAGEVRIO) 200 mg CAPS capsule Take 4 capsules (800 mg total) by mouth 2 (two) times daily for 5 days. 05/11/23 05/16/23 Yes Radford Pax, NP  venlafaxine XR (EFFEXOR-XR) 150 MG 24 hr capsule Take 150 mg by mouth daily with breakfast.   Yes [provider]  verapamil (CALAN-SR) 180 MG CR tablet Take 360 mg by mouth at bedtime.   Yes [provider]  albuterol (PROVENTIL HFA;VENTOLIN HFA) 108 (90 BASE) MCG/ACT inhaler Inhale 2 puffs into the lungs every 4 (four) hours as needed for wheezing or shortness of breath (using q every hour).    [provider]   albuterol (PROVENTIL) (2.5 MG/3ML) 0.083% nebulizer solution Take 2.5 mg by nebulization every 6 (six) hours as needed for wheezing or shortness of breath.   Yes [provider]  brompheniramine-pseudoephedrine-DM 30-2-10 MG/5ML syrup Take 5 mLs by mouth 4 (four) times daily as needed. 06/14/14   Joni Reining, PA-C  montelukast (SINGULAIR) 10 MG tablet Take 10 mg by mouth daily.   Yes [provider]  predniSONE (STERAPRED UNI-PAK 21 TAB) 10 MG (21) TBPK tablet Take 1 tablet (10 mg total) by mouth daily. Taper over 6 days. 06/14/14   Joni Reining, PA-C  rizatriptan (MAXALT-MLT) 10 MG disintegrating tablet Take by mouth.    [provider]  traZODone (DESYREL) 100 MG tablet Take 100 mg by mouth at bedtime.    [provider]    Family History History reviewed. No pertinent family history.  Social History Social History   Tobacco Use   Smoking status: Former  Building services engineer status: Never Used  Substance Use Topics   Alcohol use: Yes   Drug use: Not Currently     Allergies   Sulfa antibiotics   Review of Systems Review of Systems  HENT:  Positive for congestion, ear pain and sore throat.   Respiratory:  Positive for cough and shortness of breath.   Gastrointestinal:  Positive for vomiting.  Musculoskeletal:  Positive for myalgias.  Neurological:  Positive  for headaches.     Physical Exam Triage Vital Signs ED Triage Vitals  Encounter Vitals Group     BP 05/11/23 0959 (!) 135/93     Systolic BP Percentile --      Diastolic BP Percentile --      Pulse Rate 05/11/23 0959 82     Resp 05/11/23 0959 18     Temp 05/11/23 0959 99.7 F (37.6 C)     Temp Source 05/11/23 0959 Oral     SpO2 05/11/23 0959 95 %     Weight 05/11/23 0956 160 lb 0.9 oz (72.6 kg)     Height 05/11/23 0956 5\' 5"  (1.651 m)     Head Circumference --      Peak Flow --      Pain Score 05/11/23 0955 6     Pain Loc --      Pain Education --      Exclude from  Growth Chart --    No data found.  Updated Vital Signs BP (!) 135/93 (BP Location: Left Arm)   Pulse 82   Temp 99.7 F (37.6 C) (Oral)   Resp 18   Ht 5\' 5"  (1.651 m)   Wt 160 lb 0.9 oz (72.6 kg)   SpO2 95%   BMI 26.63 kg/m   Visual Acuity Right Eye Distance:   Left Eye Distance:   Bilateral Distance:    Right Eye Near:   Left Eye Near:    Bilateral Near:     Physical Exam Vitals and nursing note reviewed.  Constitutional:      General: She is not in acute distress.    Appearance: She is well-developed. She is not ill-appearing.  HENT:     Head: Normocephalic and atraumatic.     Right Ear: Tympanic membrane and ear canal normal.     Left Ear: Tympanic membrane and ear canal normal.     Nose: Congestion present.     Mouth/Throat:     Mouth: Mucous membranes are moist.     Pharynx: Oropharynx is clear. Uvula midline. Posterior oropharyngeal erythema present.     Tonsils: No tonsillar exudate or tonsillar abscesses.  Eyes:     Conjunctiva/sclera: Conjunctivae normal.     Pupils: Pupils are equal, round, and reactive to light.  Cardiovascular:     Rate and Rhythm: Normal rate and regular rhythm.     Heart sounds: Normal heart sounds.  Pulmonary:     Effort: Pulmonary effort is normal.     Breath sounds: Normal breath sounds. No wheezing, rhonchi or rales.  Musculoskeletal:     Cervical back: Normal range of motion and neck supple.  Lymphadenopathy:     Cervical: No cervical adenopathy.  Skin:    General: Skin is warm and dry.  Neurological:     General: No focal deficit present.     Mental Status: She is alert and oriented to person, place, and time.  Psychiatric:        Mood and Affect: Mood normal.        Behavior: Behavior normal.      UC Treatments / Results  Labs (all labs ordered are listed, but only abnormal results are displayed) Labs Reviewed  RESP PANEL BY RT-PCR (FLU A&B, COVID) ARPGX2 - Abnormal; Notable for the following components:       Result Value   SARS Coronavirus 2 by RT PCR POSITIVE (*)    All other components within normal limits   Basic Metabolic  Panel Order: [id number] Component Ref Range & Units 2 yr ago  Sodium 136 - 145 mmol/L 139  Potassium 3.5 - 5.1 mmol/L 4.2  Chloride 98 - 107 mmol/L 103  CO2 20.0 - 31.0 mmol/L 31.0  Anion Gap 3 - 11 mmol/L 5  BUN 9 - 23 mg/dL 9  Creatinine 4.13 - 2.44 mg/dL 0.10 High   BUN/Creatinine Ratio 10  eGFR CKD-EPI (2021) Female >=60 mL/min/1.8m2 75  Comment: eGFR calculated with CKD-EPI 2021 equation in accordance with [employer] and [employer] of Nephrology Task Force recommendations.  Glucose 70 - 179 mg/dL 89  Calcium 8.7 - 27.2 mg/dL 9.2  Resulting Agency [facility]   Specimen Collected: 08/08/20 13:40   Performed by: [facility] Last Resulted: 08/08/20 14:17   EKG   Radiology No results found.  Procedures Procedures (including critical care time)  Medications Ordered in UC Medications - No data to display  Initial Impression / Assessment and Plan / UC Course  I have reviewed the triage vital signs and the nursing notes.  Pertinent labs & imaging results that were available during my care of the patient were reviewed by me and considered in my medical decision making (see chart for details).     Reviewed exam and symptoms with patient.  No red flags.  Positive COVID-19 PCR.  I did reviewed patient medication list.  As she is on Suboxone unable to start Paxlovid.  Did send Rx for molnupiravir to take twice daily for 5 days.  Patient will continue albuterol inhaler as needed.  Discussed rest fluids and OTC cough medicine/fever reducing medications as needed.  Declined Rx cough medicine.  Advised PCP follow-up 2 to 3 days for recheck.  ER precautions reviewed and patient verbalized understanding. Final Clinical Impressions(s) / UC Diagnoses   Final diagnoses:  COVID-19     Discharge Instructions       You have tested positive for COVID-19.  Continue over-the-counter cough medicine and fever reduction medications such as Tylenol or ibuprofen as needed.  You may start molnupiravir which is an antiviral medication used to help treat the COVID symptoms.  Use your albuterol inhaler as needed.  Lots of rest and fluids.  Please follow-up with your PCP in 2 to 3 days for recheck.  Please go to the ER if you develop any worsening symptoms.  Hope you feel better soon!     ED Prescriptions     Medication Sig Dispense Auth. Provider   molnupiravir EUA (LAGEVRIO) 200 mg CAPS capsule Take 4 capsules (800 mg total) by mouth 2 (two) times daily for 5 days. 40 capsule Radford Pax, NP      PDMP not reviewed this encounter.   Radford Pax, NP 05/11/23 1103    Radford Pax, NP 05/11/23 1104

## 2023-05-11 NOTE — ED Triage Notes (Signed)
 Pt c/o body aches, cough, headache, runny nose vomiting. Started about 3 days ago. Denies fever.

## 2023-05-11 NOTE — Discharge Instructions (Signed)
 You have tested positive for COVID-19.  Continue over-the-counter cough medicine and fever reduction medications such as Tylenol or ibuprofen as needed.  You may start molnupiravir which is an antiviral medication used to help treat the COVID symptoms.  Use your albuterol inhaler as needed.  Lots of rest and fluids.  Please follow-up with your PCP in 2 to 3 days for recheck.  Please go to the ER if you develop any worsening symptoms.  Hope you feel better soon!

## 2023-05-12 ENCOUNTER — Inpatient Hospital Stay
Admission: EM | Admit: 2023-05-12 | Discharge: 2023-06-05 | DRG: 871 | Disposition: E | Attending: Internal Medicine | Admitting: Internal Medicine

## 2023-05-12 ENCOUNTER — Inpatient Hospital Stay

## 2023-05-12 ENCOUNTER — Emergency Department

## 2023-05-12 DIAGNOSIS — I468 Cardiac arrest due to other underlying condition: Secondary | ICD-10-CM | POA: Diagnosis not present

## 2023-05-12 DIAGNOSIS — D751 Secondary polycythemia: Secondary | ICD-10-CM | POA: Diagnosis present

## 2023-05-12 DIAGNOSIS — Z87891 Personal history of nicotine dependence: Secondary | ICD-10-CM | POA: Diagnosis not present

## 2023-05-12 DIAGNOSIS — R0602 Shortness of breath: Secondary | ICD-10-CM | POA: Diagnosis present

## 2023-05-12 DIAGNOSIS — E871 Hypo-osmolality and hyponatremia: Secondary | ICD-10-CM | POA: Diagnosis present

## 2023-05-12 DIAGNOSIS — Z7951 Long term (current) use of inhaled steroids: Secondary | ICD-10-CM | POA: Diagnosis not present

## 2023-05-12 DIAGNOSIS — E8721 Acute metabolic acidosis: Secondary | ICD-10-CM | POA: Diagnosis present

## 2023-05-12 DIAGNOSIS — R042 Hemoptysis: Secondary | ICD-10-CM | POA: Diagnosis not present

## 2023-05-12 DIAGNOSIS — E876 Hypokalemia: Secondary | ICD-10-CM | POA: Diagnosis present

## 2023-05-12 DIAGNOSIS — Z66 Do not resuscitate: Secondary | ICD-10-CM | POA: Diagnosis present

## 2023-05-12 DIAGNOSIS — D709 Neutropenia, unspecified: Secondary | ICD-10-CM | POA: Diagnosis present

## 2023-05-12 DIAGNOSIS — U071 COVID-19: Secondary | ICD-10-CM | POA: Diagnosis present

## 2023-05-12 DIAGNOSIS — I10 Essential (primary) hypertension: Secondary | ICD-10-CM | POA: Diagnosis present

## 2023-05-12 DIAGNOSIS — J9601 Acute respiratory failure with hypoxia: Secondary | ICD-10-CM | POA: Diagnosis present

## 2023-05-12 DIAGNOSIS — Z79899 Other long term (current) drug therapy: Secondary | ICD-10-CM | POA: Diagnosis not present

## 2023-05-12 DIAGNOSIS — R6521 Severe sepsis with septic shock: Secondary | ICD-10-CM | POA: Diagnosis present

## 2023-05-12 DIAGNOSIS — J45909 Unspecified asthma, uncomplicated: Secondary | ICD-10-CM | POA: Diagnosis present

## 2023-05-12 DIAGNOSIS — A4189 Other specified sepsis: Principal | ICD-10-CM | POA: Diagnosis present

## 2023-05-12 DIAGNOSIS — Z882 Allergy status to sulfonamides status: Secondary | ICD-10-CM

## 2023-05-12 DIAGNOSIS — J80 Acute respiratory distress syndrome: Secondary | ICD-10-CM | POA: Diagnosis present

## 2023-05-12 DIAGNOSIS — J1282 Pneumonia due to coronavirus disease 2019: Secondary | ICD-10-CM | POA: Diagnosis present

## 2023-05-12 DIAGNOSIS — I469 Cardiac arrest, cause unspecified: Secondary | ICD-10-CM | POA: Diagnosis not present

## 2023-05-12 DIAGNOSIS — N179 Acute kidney failure, unspecified: Secondary | ICD-10-CM | POA: Diagnosis not present

## 2023-05-12 DIAGNOSIS — N170 Acute kidney failure with tubular necrosis: Secondary | ICD-10-CM | POA: Diagnosis present

## 2023-05-12 DIAGNOSIS — A419 Sepsis, unspecified organism: Secondary | ICD-10-CM | POA: Diagnosis not present

## 2023-05-12 DIAGNOSIS — J1289 Other viral pneumonia: Secondary | ICD-10-CM | POA: Diagnosis not present

## 2023-05-12 LAB — CBC WITH DIFFERENTIAL/PLATELET
Abs Immature Granulocytes: 0.01 10*3/uL (ref 0.00–0.07)
Basophils Absolute: 0 10*3/uL (ref 0.0–0.1)
Basophils Relative: 1 %
Eosinophils Absolute: 0 10*3/uL (ref 0.0–0.5)
Eosinophils Relative: 0 %
HCT: 52.6 % — ABNORMAL HIGH (ref 36.0–46.0)
Hemoglobin: 17.6 g/dL — ABNORMAL HIGH (ref 12.0–15.0)
Immature Granulocytes: 1 %
Lymphocytes Relative: 43 %
Lymphs Abs: 0.4 10*3/uL — ABNORMAL LOW (ref 0.7–4.0)
MCH: 27.9 pg (ref 26.0–34.0)
MCHC: 33.5 g/dL (ref 30.0–36.0)
MCV: 83.5 fL (ref 80.0–100.0)
Monocytes Absolute: 0.1 10*3/uL (ref 0.1–1.0)
Monocytes Relative: 7 %
Neutro Abs: 0.4 10*3/uL — CL (ref 1.7–7.7)
Neutrophils Relative %: 48 %
Platelets: 164 10*3/uL (ref 150–400)
RBC: 6.3 MIL/uL — ABNORMAL HIGH (ref 3.87–5.11)
RDW: 14 % (ref 11.5–15.5)
Smear Review: NORMAL
WBC: 0.9 10*3/uL — CL (ref 4.0–10.5)
nRBC: 0 % (ref 0.0–0.2)

## 2023-05-12 LAB — COMPREHENSIVE METABOLIC PANEL WITH GFR
ALT: 48 U/L — ABNORMAL HIGH (ref 0–44)
AST: 73 U/L — ABNORMAL HIGH (ref 15–41)
Albumin: 2.8 g/dL — ABNORMAL LOW (ref 3.5–5.0)
Alkaline Phosphatase: 52 U/L (ref 38–126)
Anion gap: 21 — ABNORMAL HIGH (ref 5–15)
BUN: 23 mg/dL — ABNORMAL HIGH (ref 6–20)
CO2: 16 mmol/L — ABNORMAL LOW (ref 22–32)
Calcium: 8 mg/dL — ABNORMAL LOW (ref 8.9–10.3)
Chloride: 95 mmol/L — ABNORMAL LOW (ref 98–111)
Creatinine, Ser: 2.25 mg/dL — ABNORMAL HIGH (ref 0.44–1.00)
GFR, Estimated: 26 mL/min — ABNORMAL LOW (ref >60)
Glucose, Bld: 286 mg/dL — ABNORMAL HIGH (ref 70–99)
Potassium: 2.4 mmol/L — CL (ref 3.5–5.1)
Sodium: 132 mmol/L — ABNORMAL LOW (ref 135–145)
Total Bilirubin: 1.5 mg/dL — ABNORMAL HIGH (ref 0.0–1.2)
Total Protein: 6.3 g/dL — ABNORMAL LOW (ref 6.5–8.1)

## 2023-05-12 LAB — TROPONIN I (HIGH SENSITIVITY)
Troponin I (High Sensitivity): 19 ng/L — ABNORMAL HIGH (ref <18)
Troponin I (High Sensitivity): 30 ng/L — ABNORMAL HIGH (ref <18)

## 2023-05-12 LAB — BRAIN NATRIURETIC PEPTIDE: B Natriuretic Peptide: 143.7 pg/mL — ABNORMAL HIGH (ref 0.0–100.0)

## 2023-05-12 LAB — LACTIC ACID, PLASMA: Lactic Acid, Venous: 8.9 mmol/L (ref 0.5–1.9)

## 2023-05-12 LAB — PROCALCITONIN: Procalcitonin: >150 ng/mL

## 2023-05-12 MED ORDER — DOCUSATE SODIUM 100 MG PO CAPS: 100.0000 mg | ORAL_CAPSULE | Freq: Two times a day (BID) | ORAL | Status: DC | PRN

## 2023-05-12 MED ORDER — IPRATROPIUM-ALBUTEROL 0.5-2.5 (3) MG/3ML IN SOLN
3.0000 mL | Freq: Once | RESPIRATORY_TRACT | Status: AC
  Administered 2023-05-12: 3 mL via RESPIRATORY_TRACT
  Filled 2023-05-12: qty 3

## 2023-05-12 MED ORDER — FENTANYL CITRATE PF 50 MCG/ML IJ SOSY
50.0000 ug | PREFILLED_SYRINGE | Freq: Once | INTRAMUSCULAR | Status: AC
  Administered 2023-05-12: 50 ug via INTRAVENOUS
  Filled 2023-05-12: qty 1

## 2023-05-12 MED ORDER — FENTANYL CITRATE (PF) 100 MCG/2ML IJ SOLN: 100.0000 ug | Freq: Once | INTRAMUSCULAR | Status: AC

## 2023-05-12 MED ORDER — ORAL CARE MOUTH RINSE
15.0000 mL | OROMUCOSAL | Status: DC
  Filled 2023-05-12: qty 15

## 2023-05-12 MED ORDER — METHYLPREDNISOLONE SODIUM SUCC 125 MG IJ SOLR
125.0000 mg | Freq: Once | INTRAMUSCULAR | Status: AC
  Administered 2023-05-12: 125 mg via INTRAVENOUS
  Filled 2023-05-12: qty 2

## 2023-05-12 MED ORDER — DEXMEDETOMIDINE HCL IN NACL 400 MCG/100ML IV SOLN: 0.0000 ug/kg/h | INTRAVENOUS | Status: DC

## 2023-05-12 MED ORDER — LACTATED RINGERS IV BOLUS
1000.0000 mL | Freq: Once | INTRAVENOUS | Status: AC
  Administered 2023-05-12: 1000 mL via INTRAVENOUS

## 2023-05-12 MED ORDER — SODIUM CHLORIDE 0.9 % IV SOLN
2.0000 g | Freq: Once | INTRAVENOUS | Status: AC
  Administered 2023-05-12: 2 g via INTRAVENOUS
  Filled 2023-05-12: qty 12.5

## 2023-05-12 MED ORDER — SODIUM CHLORIDE 0.9 % IV SOLN: 250.0000 mL | INTRAVENOUS | Status: DC

## 2023-05-12 MED ORDER — INSULIN ASPART 100 UNIT/ML IJ SOLN
0.0000 [IU] | INTRAMUSCULAR | Status: DC
Start: 2023-05-12 — End: 2023-05-14
  Administered 2023-05-12: 5 [IU] via SUBCUTANEOUS
  Administered 2023-05-13 (×2): 2 [IU] via SUBCUTANEOUS
  Administered 2023-05-13: 3 [IU] via SUBCUTANEOUS
  Filled 2023-05-12 (×5): qty 1

## 2023-05-12 MED ORDER — POTASSIUM CHLORIDE 10 MEQ/100ML IV SOLN
10.0000 meq | INTRAVENOUS | Status: AC
  Administered 2023-05-12 – 2023-05-13 (×4): 10 meq via INTRAVENOUS
  Filled 2023-05-12 (×7): qty 100

## 2023-05-12 MED ORDER — SODIUM CHLORIDE 0.9 % IV SOLN
500.0000 mg | INTRAVENOUS | Status: DC
  Administered 2023-05-12: 500 mg via INTRAVENOUS
  Filled 2023-05-12 (×2): qty 5

## 2023-05-12 MED ORDER — ROCURONIUM BROMIDE 10 MG/ML (PF) SYRINGE
50.0000 mg | PREFILLED_SYRINGE | Freq: Once | INTRAVENOUS | Status: AC
  Administered 2023-05-13: 60 mg via INTRAVENOUS

## 2023-05-12 MED ORDER — ETOMIDATE 2 MG/ML IV SOLN: 20.0000 mg | Freq: Once | INTRAVENOUS | Status: AC

## 2023-05-12 MED ORDER — DEXMEDETOMIDINE HCL IN NACL 400 MCG/100ML IV SOLN
INTRAVENOUS | Status: AC
  Filled 2023-05-12: qty 100

## 2023-05-12 MED ORDER — NOREPINEPHRINE 4 MG/250ML-% IV SOLN
2.0000 ug/min | INTRAVENOUS | Status: DC
  Administered 2023-05-12: 2 ug/min via INTRAVENOUS
  Filled 2023-05-12 (×2): qty 250

## 2023-05-12 MED ORDER — ONDANSETRON HCL 4 MG/2ML IJ SOLN
4.0000 mg | Freq: Once | INTRAMUSCULAR | Status: AC
  Administered 2023-05-12: 4 mg via INTRAVENOUS
  Filled 2023-05-12: qty 2

## 2023-05-12 MED ORDER — POLYETHYLENE GLYCOL 3350 17 G PO PACK: 17.0000 g | PACK | Freq: Every day | ORAL | Status: DC | PRN

## 2023-05-12 MED ORDER — LACTATED RINGERS IV SOLN: INTRAVENOUS | Status: DC

## 2023-05-12 MED ORDER — MORPHINE SULFATE (PF) 2 MG/ML IV SOLN
2.0000 mg | INTRAVENOUS | Status: DC | PRN
  Administered 2023-05-12: 2 mg via INTRAVENOUS
  Filled 2023-05-12: qty 1

## 2023-05-12 MED ORDER — HEPARIN SODIUM (PORCINE) 5000 UNIT/ML IJ SOLN
5000.0000 [IU] | Freq: Three times a day (TID) | INTRAMUSCULAR | Status: DC
  Administered 2023-05-12: 5000 [IU] via SUBCUTANEOUS
  Filled 2023-05-12: qty 1

## 2023-05-12 MED ORDER — ORAL CARE MOUTH RINSE: 15.0000 mL | OROMUCOSAL | Status: DC | PRN

## 2023-05-12 MED ORDER — SODIUM CHLORIDE 0.9 % IV BOLUS
1000.0000 mL | Freq: Once | INTRAVENOUS | Status: AC
Start: 2023-05-12 — End: 2023-05-12
  Administered 2023-05-12: 1000 mL via INTRAVENOUS

## 2023-05-12 NOTE — ED Notes (Signed)
 RN awaiting RT for transport to CT scan and then up to floor.

## 2023-05-12 NOTE — Progress Notes (Signed)
 PHARMACY CONSULT NOTE - FOLLOW UP  Pharmacy Consult for Electrolyte Monitoring and Replacement   Recent Labs: Potassium (mmol/L)  Date Value  05/12/2023 2.4 (LL)  10/18/2012 3.7   Calcium (mg/dL)  Date Value  16/11/9602 8.0 (L)   Calcium, Total (mg/dL)  Date Value  54/10/8117 8.9   Albumin (g/dL)  Date Value  14/78/2956 2.8 (L)  10/16/2012 4.4   Sodium (mmol/L)  Date Value  05/12/2023 132 (L)  10/16/2012 135 (L)     Assessment: 4/7 @ 2108 = 2.4  Goal of Therapy:  Electrolytes WNL   Plan:  KCl 10 mEq IV X 4 ordered to start on 4/7 @ ~ 2330. - will replete conservatively b/c of decreased renal function - will recheck electrolytes on 4/8 @ 0500  Trica Usery D ,PharmD Clinical Pharmacist 05/12/2023 11:08 PM

## 2023-05-12 NOTE — ED Notes (Signed)
 WBC Count: 0.9  Neurophil absolute 0.4

## 2023-05-12 NOTE — Consult Note (Signed)
 CODE SEPSIS - PHARMACY COMMUNICATION  **Broad Spectrum Antibiotics should be administered within 1 hour of Sepsis diagnosis**  Time Code Sepsis Called/Page Received: 2109  Antibiotics Ordered: cefepime, azithromycin  Time of 1st antibiotic administration: 2131  Additional action taken by pharmacy: none  If necessary, Name of Provider/Nurse Contacted: n/a   Rosana Farnell Rodriguez-Guzman PharmD, BCPS 05/12/2023 9:35 PM

## 2023-05-12 NOTE — ED Provider Notes (Signed)
 [facility] Provider Note    Event Date/Time   First MD Initiated Contact with Patient 05/12/23 1949     (approximate)   History   No chief complaint on file.   HPI  Caitlin Nicholson is a 48 y.o. female with history of asthma who comes in with shortness of breath.  Patient was diagnosed with COVID yesterday.  She has been feeling sick for the past few days.  However today she had acutely worsening shortness of breath.  She was found to be in the 70s to 80s and placed on nonrebreather without much improvement patient was on CPAP due to work of breathing.   Physical Exam   Triage Vital Signs: There were no vitals taken for this visit.  Most recent vital signs: Vitals:   05/12/23 2150 05/12/23 2155  BP: 90/62 (!) 86/63  Pulse: (!) 113 (!) 114  Resp: (!) 28 (!) 24  Temp:    SpO2: 98% 97%     General: Awake, no distress.  CV:  Good peripheral perfusion.  Resp:  Increased work of breathing crackles tightness noted Abd:  No distention.  Other:  No swelling in legs.  No calf tenderness   ED Results / Procedures / Treatments   Labs (all labs ordered are listed, but only abnormal results are displayed) Labs Reviewed  CBC WITH DIFFERENTIAL/PLATELET - Abnormal; Notable for the following components:      Result Value   RBC 6.30 (*)    Hemoglobin 17.6 (*)    HCT 52.6 (*)    All other components within normal limits  BRAIN NATRIURETIC PEPTIDE  BLOOD GAS, VENOUS  COMPREHENSIVE METABOLIC PANEL WITH GFR  PROCALCITONIN  TROPONIN I (HIGH SENSITIVITY)     EKG  My interpretation of EKG:  Sinus tachycardia rate of 142 without any ST elevation or T wave inversions, normal intervals  RADIOLOGY I have reviewed the xray personally and interpreted patient has bilateral opacifications noted   PROCEDURES:  Critical Care performed: Yes, see critical care procedure note(s)  .Critical Care  Performed by: Concha Se, MD Authorized by: Concha Se, MD   Critical care provider statement:    Critical care time (minutes):  30   Critical care was necessary to treat or prevent imminent or life-threatening deterioration of the following conditions:  Respiratory failure   Critical care was time spent personally by me on the following activities:  Development of treatment plan with patient or surrogate, discussions with consultants, evaluation of patient's response to treatment, examination of patient, ordering and review of laboratory studies, ordering and review of radiographic studies, ordering and performing treatments and interventions, pulse oximetry, re-evaluation of patient's condition and review of old charts .1-3 Lead EKG Interpretation  Performed by: Concha Se, MD Authorized by: Concha Se, MD     Interpretation: abnormal     ECG rate:  114   ECG rate assessment: tachycardic     Rhythm: sinus tachycardia     Ectopy: none     Conduction: normal      MEDICATIONS ORDERED IN ED: Medications  azithromycin (ZITHROMAX) 500 mg in sodium chloride 0.9 % 250 mL IVPB (has no administration in time range)  norepinephrine (LEVOPHED) 4mg  in (0.016 mg/mL) premix infusion (has no administration in time range)  docusate sodium (COLACE) capsule 100 mg (has no administration in time range)  polyethylene glycol (MIRALAX / GLYCOLAX) packet 17 g (has no administration in time range)  heparin injection  5,000 Units (has no administration in time range)  insulin aspart (novoLOG) injection 0-15 Units (has no administration in time range)  ipratropium-albuterol (DUONEB) 0.5-2.5 (3) MG/3ML nebulizer solution 3 mL (3 mLs Nebulization Given 05/12/23 2013)  methylPREDNISolone sodium succinate (SOLU-MEDROL) 125 mg/2 mL injection 125 mg (125 mg Intravenous Given 05/12/23 2021)  sodium chloride 0.9 % bolus 1,000 mL (1,000 mLs Intravenous New Bag/Given 05/12/23 2059)  fentaNYL (SUBLIMAZE) injection 50 mcg (50 mcg Intravenous Given 05/12/23 2059)   ondansetron (ZOFRAN) injection 4 mg (4 mg Intravenous Given 05/12/23 2059)  ceFEPIme (MAXIPIME) 2 g in sodium chloride 0.9 % 100 mL IVPB (2 g Intravenous New Bag/Given 05/12/23 2131)     IMPRESSION / MDM / ASSESSMENT AND PLAN / ED COURSE  I reviewed the triage vital signs and the nursing notes.   Patient's presentation is most consistent with acute presentation with potential threat to life or bodily function.   Patient comes in with concerns for shortness of breath.  Differential includes COVID, pneumonia, fluid, asthma.  Patient does not have significant wheezing on examination I will trial DuoNeb to see if it helps improve symptoms.  Patient is working to breathe will place on BiPAP.  Patient did not have any improvement after DuoNeb.  Chest x-ray with infiltrates noted bilaterally.  This is concerning for COVID-pneumonia.  However given her neutropenia that was noted I did add on blood cultures, lactate and will start broad-spectrum antibiotics.  I will give 1 L of fluid given elevated BNP and with these concern for COVID lungs I do not want to fluid overload her.  Given some soft blood pressures I will start her on some low-dose Levophed.  She looks more comfortable on the BiPAP but I am concerned for potential deterioration.  Therefore I discussed with the ICU team for admission.  10:07 PM after 1 L of fluid patient's heart rates have come down blood pressures have maps over 60 but systolic is still in the 80s patient on low-dose Levophed.  Holding off on further fluid due to concern because issues with her lungs given elevated BNP  The patient is on the cardiac monitor to evaluate for evidence of arrhythmia and/or significant heart rate changes.      FINAL CLINICAL IMPRESSION(S) / ED DIAGNOSES   Final diagnoses:  Pneumonia due to COVID-19 virus  Acute respiratory failure with hypoxia (HCC)     Rx / DC Orders   ED Discharge Orders     None        Note:  This document was  prepared using Dragon voice recognition software and may include unintentional dictation errors.   Concha Se, MD 05/12/23 [phone]

## 2023-05-12 NOTE — ED Triage Notes (Addendum)
 Pt BIB [facility]. Diagnosed with Covid yesterday, SOB today, found with O2 saturation of 82% RA. [place] at 6 lpm did not help and pt placed on NRB with improvement to 85%. EMS found crackles in lungs. 134 HR ; 124/86 BP; Pt now on CPAP 88%. Hx Asthma. Alert and oriented. Weak. GCS 15.

## 2023-05-12 NOTE — ED Notes (Signed)
 Patient transported to CT with assigned RN AJ and respiratory. Pt to be transported from CT to assigned ICU room.

## 2023-05-12 NOTE — Progress Notes (Signed)
 Attempted to receive report from ED RN, but phone just continuously rang. Secure chat sent requesting call back.

## 2023-05-13 ENCOUNTER — Inpatient Hospital Stay

## 2023-05-13 DIAGNOSIS — I469 Cardiac arrest, cause unspecified: Secondary | ICD-10-CM | POA: Diagnosis not present

## 2023-05-13 DIAGNOSIS — E876 Hypokalemia: Secondary | ICD-10-CM

## 2023-05-13 DIAGNOSIS — U071 COVID-19: Secondary | ICD-10-CM

## 2023-05-13 DIAGNOSIS — E8721 Acute metabolic acidosis: Secondary | ICD-10-CM

## 2023-05-13 DIAGNOSIS — R6521 Severe sepsis with septic shock: Secondary | ICD-10-CM

## 2023-05-13 DIAGNOSIS — J1289 Other viral pneumonia: Secondary | ICD-10-CM

## 2023-05-13 DIAGNOSIS — N179 Acute kidney failure, unspecified: Secondary | ICD-10-CM

## 2023-05-13 DIAGNOSIS — A419 Sepsis, unspecified organism: Secondary | ICD-10-CM

## 2023-05-13 DIAGNOSIS — E871 Hypo-osmolality and hyponatremia: Secondary | ICD-10-CM

## 2023-05-13 DIAGNOSIS — J80 Acute respiratory distress syndrome: Secondary | ICD-10-CM | POA: Diagnosis not present

## 2023-05-13 DIAGNOSIS — J9601 Acute respiratory failure with hypoxia: Secondary | ICD-10-CM

## 2023-05-13 LAB — MAGNESIUM: Magnesium: 1.8 mg/dL (ref 1.7–2.4)

## 2023-05-13 LAB — BLOOD GAS, ARTERIAL
Acid-base deficit: 14.2 mmol/L — ABNORMAL HIGH (ref 0.0–2.0)
Acid-base deficit: 6.3 mmol/L — ABNORMAL HIGH (ref 0.0–2.0)
Acid-base deficit: 13.4 mmol/L — ABNORMAL HIGH (ref 0.0–2.0)
Bicarbonate: 17.9 mmol/L — ABNORMAL LOW (ref 20.0–28.0)
Bicarbonate: 23.3 mmol/L (ref 20.0–28.0)
Bicarbonate: 17.7 mmol/L — ABNORMAL LOW (ref 20.0–28.0)
FIO2: 100 %
FIO2: 100 %
FIO2: 100 %
MECHVT: 450 mL
MECHVT: 450 mL
MECHVT: 450 mL
Mechanical Rate: 16
Mechanical Rate: 22
Mechanical Rate: 22
O2 Saturation: 87.9 %
O2 Saturation: 91.5 %
O2 Saturation: 85.3 %
PEEP: 15 cmH2O
PEEP: 15 cmH2O
PEEP: 15 cmH2O
Patient temperature: 37
Patient temperature: 37
Patient temperature: 37
pCO2 arterial: 71 mmHg (ref 32–48)
pCO2 arterial: 64 mmHg — ABNORMAL HIGH (ref 32–48)
pCO2 arterial: 64 mmHg — ABNORMAL HIGH (ref 32–48)
pH, Arterial: 7.01 — CL (ref 7.35–7.45)
pH, Arterial: 7.17 — CL (ref 7.35–7.45)
pH, Arterial: 7.05 — CL (ref 7.35–7.45)
pO2, Arterial: 65 mmHg — ABNORMAL LOW (ref 83–108)
pO2, Arterial: 61 mmHg — ABNORMAL LOW (ref 83–108)
pO2, Arterial: 60 mmHg — ABNORMAL LOW (ref 83–108)

## 2023-05-13 LAB — URINE DRUG SCREEN, QUALITATIVE (ARMC ONLY)
Amphetamines, Ur Screen: NOT DETECTED
Barbiturates, Ur Screen: NOT DETECTED
Benzodiazepine, Ur Scrn: NOT DETECTED
Cannabinoid 50 Ng, Ur ~~LOC~~: POSITIVE — AB
Cocaine Metabolite,Ur ~~LOC~~: NOT DETECTED
MDMA (Ecstasy)Ur Screen: NOT DETECTED
Methadone Scn, Ur: NOT DETECTED
Opiate, Ur Screen: NOT DETECTED
Phencyclidine (PCP) Ur S: NOT DETECTED
Tricyclic, Ur Screen: NOT DETECTED

## 2023-05-13 LAB — COMPREHENSIVE METABOLIC PANEL WITH GFR
ALT: 66 U/L — ABNORMAL HIGH (ref 0–44)
ALT: 53 U/L — ABNORMAL HIGH (ref 0–44)
AST: 130 U/L — ABNORMAL HIGH (ref 15–41)
AST: 173 U/L — ABNORMAL HIGH (ref 15–41)
Albumin: 1.6 g/dL — ABNORMAL LOW (ref 3.5–5.0)
Albumin: <1.5 g/dL — ABNORMAL LOW (ref 3.5–5.0)
Alkaline Phosphatase: 28 U/L — ABNORMAL LOW (ref 38–126)
Alkaline Phosphatase: 34 U/L — ABNORMAL LOW (ref 38–126)
Anion gap: 18 — ABNORMAL HIGH (ref 5–15)
Anion gap: 18 — ABNORMAL HIGH (ref 5–15)
BUN: 24 mg/dL — ABNORMAL HIGH (ref 6–20)
BUN: 30 mg/dL — ABNORMAL HIGH (ref 6–20)
CO2: 19 mmol/L — ABNORMAL LOW (ref 22–32)
CO2: 18 mmol/L — ABNORMAL LOW (ref 22–32)
Calcium: 6.5 mg/dL — ABNORMAL LOW (ref 8.9–10.3)
Calcium: 6 mg/dL — CL (ref 8.9–10.3)
Chloride: 100 mmol/L (ref 98–111)
Chloride: 98 mmol/L (ref 98–111)
Creatinine, Ser: 1.81 mg/dL — ABNORMAL HIGH (ref 0.44–1.00)
Creatinine, Ser: 2.37 mg/dL — ABNORMAL HIGH (ref 0.44–1.00)
GFR, Estimated: 34 mL/min — ABNORMAL LOW (ref >60)
GFR, Estimated: 24 mL/min — ABNORMAL LOW (ref >60)
Glucose, Bld: 201 mg/dL — ABNORMAL HIGH (ref 70–99)
Glucose, Bld: 110 mg/dL — ABNORMAL HIGH (ref 70–99)
Potassium: 2.7 mmol/L — CL (ref 3.5–5.1)
Potassium: 4 mmol/L (ref 3.5–5.1)
Sodium: 137 mmol/L (ref 135–145)
Sodium: 134 mmol/L — ABNORMAL LOW (ref 135–145)
Total Bilirubin: 0.7 mg/dL (ref 0.0–1.2)
Total Bilirubin: 1 mg/dL (ref 0.0–1.2)
Total Protein: 3.6 g/dL — ABNORMAL LOW (ref 6.5–8.1)
Total Protein: 3.2 g/dL — ABNORMAL LOW (ref 6.5–8.1)

## 2023-05-13 LAB — CBC
HCT: 41.6 % (ref 36.0–46.0)
Hemoglobin: 13.9 g/dL (ref 12.0–15.0)
MCH: 27.8 pg (ref 26.0–34.0)
MCHC: 33.4 g/dL (ref 30.0–36.0)
MCV: 83.2 fL (ref 80.0–100.0)
Platelets: 85 10*3/uL — ABNORMAL LOW (ref 150–400)
RBC: 5 MIL/uL (ref 3.87–5.11)
RDW: 13.2 % (ref 11.5–15.5)
WBC: 0.9 10*3/uL — CL (ref 4.0–10.5)
nRBC: 0 % (ref 0.0–0.2)

## 2023-05-13 LAB — TECHNOLOGIST SMEAR REVIEW: Plt Morphology: NORMAL

## 2023-05-13 LAB — LACTIC ACID, PLASMA
Lactic Acid, Venous: 6 mmol/L (ref 0.5–1.9)
Lactic Acid, Venous: 7.8 mmol/L (ref 0.5–1.9)
Lactic Acid, Venous: >9 mmol/L (ref 0.5–1.9)
Lactic Acid, Venous: >9 mmol/L (ref 0.5–1.9)

## 2023-05-13 LAB — GLUCOSE, CAPILLARY
Glucose-Capillary: 237 mg/dL — ABNORMAL HIGH (ref 70–99)
Glucose-Capillary: 210 mg/dL — ABNORMAL HIGH (ref 70–99)
Glucose-Capillary: 123 mg/dL — ABNORMAL HIGH (ref 70–99)
Glucose-Capillary: 124 mg/dL — ABNORMAL HIGH (ref 70–99)

## 2023-05-13 LAB — HIV ANTIBODY (ROUTINE TESTING W REFLEX): HIV Screen 4th Generation wRfx: NONREACTIVE

## 2023-05-13 LAB — MRSA NEXT GEN BY PCR, NASAL: MRSA by PCR Next Gen: DETECTED — AB

## 2023-05-13 LAB — STREP PNEUMONIAE URINARY ANTIGEN: Strep Pneumo Urinary Antigen: NEGATIVE

## 2023-05-13 LAB — D-DIMER, QUANTITATIVE: D-Dimer, Quant: 15.5 ug{FEU}/mL — ABNORMAL HIGH (ref 0.00–0.50)

## 2023-05-13 LAB — TYPE AND SCREEN
ABO/RH(D): O POS
Antibody Screen: NEGATIVE

## 2023-05-13 LAB — FIBRINOGEN: Fibrinogen: 248 mg/dL (ref 210–475)

## 2023-05-13 LAB — PROTIME-INR
INR: 1.9 — ABNORMAL HIGH (ref 0.8–1.2)
Prothrombin Time: 22 s — ABNORMAL HIGH (ref 11.4–15.2)

## 2023-05-13 LAB — APTT: aPTT: 81 s — ABNORMAL HIGH (ref 24–36)

## 2023-05-13 MED ORDER — EPINEPHRINE HCL 5 MG/250ML IV SOLN IN NS
0.5000 ug/min | INTRAVENOUS | Status: DC
  Administered 2023-05-13: 0.5 ug/min via INTRAVENOUS
  Filled 2023-05-13 (×2): qty 250

## 2023-05-13 MED ORDER — NOREPINEPHRINE 16 MG/250ML-% IV SOLN
0.0000 ug/min | INTRAVENOUS | Status: DC
  Administered 2023-05-13: 250 ug/min via INTRAVENOUS
  Administered 2023-05-13: 400 ug/min via INTRAVENOUS
  Administered 2023-05-13: 200 ug/min via INTRAVENOUS
  Administered 2023-05-13: 10 ug/min via INTRAVENOUS
  Filled 2023-05-13 (×9): qty 250

## 2023-05-13 MED ORDER — SODIUM CHLORIDE 0.9 % IV SOLN: 100.0000 mg | Freq: Every day | INTRAVENOUS | Status: DC

## 2023-05-13 MED ORDER — SODIUM BICARBONATE 8.4 % IV SOLN
INTRAVENOUS | Status: AC
  Filled 2023-05-13: qty 50

## 2023-05-13 MED ORDER — FENTANYL BOLUS VIA INFUSION: 50.0000 ug | INTRAVENOUS | Status: DC | PRN

## 2023-05-13 MED ORDER — CHLORHEXIDINE GLUCONATE CLOTH 2 % EX PADS
6.0000 | MEDICATED_PAD | Freq: Every day | CUTANEOUS | Status: DC
  Administered 2023-05-13: 6 via TOPICAL

## 2023-05-13 MED ORDER — NOREPINEPHRINE 4 MG/250ML-% IV SOLN
0.0000 ug/min | INTRAVENOUS | Status: DC
  Administered 2023-05-13: 30 ug/min via INTRAVENOUS

## 2023-05-13 MED ORDER — BUDESONIDE 0.5 MG/2ML IN SUSP
0.5000 mg | Freq: Two times a day (BID) | RESPIRATORY_TRACT | Status: DC
  Administered 2023-05-13 (×2): 0.5 mg via RESPIRATORY_TRACT
  Filled 2023-05-13 (×2): qty 2

## 2023-05-13 MED ORDER — SODIUM BICARBONATE 8.4 % IV SOLN
100.0000 meq | Freq: Once | INTRAVENOUS | Status: AC
  Administered 2023-05-13: 100 meq via INTRAVENOUS

## 2023-05-13 MED ORDER — VECURONIUM BROMIDE 10 MG IV SOLR: 10.0000 mg | INTRAVENOUS | Status: DC | PRN

## 2023-05-13 MED ORDER — LACTATED RINGERS IV BOLUS
1000.0000 mL | Freq: Once | INTRAVENOUS | Status: AC
  Administered 2023-05-13: 1000 mL via INTRAVENOUS

## 2023-05-13 MED ORDER — SODIUM BICARBONATE 8.4 % IV SOLN
50.0000 meq | Freq: Once | INTRAVENOUS | Status: AC
  Administered 2023-05-13: 50 meq via INTRAVENOUS
  Filled 2023-05-13: qty 50

## 2023-05-13 MED ORDER — IPRATROPIUM-ALBUTEROL 0.5-2.5 (3) MG/3ML IN SOLN
3.0000 mL | RESPIRATORY_TRACT | Status: DC
  Administered 2023-05-13 (×4): 3 mL via RESPIRATORY_TRACT
  Filled 2023-05-13 (×4): qty 3

## 2023-05-13 MED ORDER — METHYLPREDNISOLONE SODIUM SUCC 125 MG IJ SOLR: 90.0000 mg | Freq: Two times a day (BID) | INTRAMUSCULAR | Status: DC

## 2023-05-13 MED ORDER — STERILE WATER FOR INJECTION IV SOLN
INTRAVENOUS | Status: DC
  Filled 2023-05-13 (×2): qty 1000
  Filled 2023-05-13 (×2): qty 150

## 2023-05-13 MED ORDER — SODIUM BICARBONATE 8.4 % IV SOLN
150.0000 meq | Freq: Once | INTRAVENOUS | Status: AC
  Administered 2023-05-13: 150 meq via INTRAVENOUS

## 2023-05-13 MED ORDER — FAMOTIDINE IN NACL 20-0.9 MG/50ML-% IV SOLN
20.0000 mg | Freq: Two times a day (BID) | INTRAVENOUS | Status: DC
Start: 2023-05-13 — End: 2023-05-13
  Administered 2023-05-13 (×2): 20 mg via INTRAVENOUS
  Filled 2023-05-13 (×2): qty 50

## 2023-05-13 MED ORDER — FAMOTIDINE IN NACL 20-0.9 MG/50ML-% IV SOLN: 20.0000 mg | INTRAVENOUS | Status: DC

## 2023-05-13 MED ORDER — FENTANYL CITRATE (PF) 100 MCG/2ML IJ SOLN
INTRAMUSCULAR | Status: AC
  Administered 2023-05-13: 100 ug via INTRAVENOUS
  Filled 2023-05-13: qty 2

## 2023-05-13 MED ORDER — TRANEXAMIC ACID FOR INHALATION
500.0000 mg | Freq: Once | RESPIRATORY_TRACT | Status: AC
  Administered 2023-05-13: 500 mg via RESPIRATORY_TRACT
  Filled 2023-05-13: qty 10

## 2023-05-13 MED ORDER — PROPOFOL 1000 MG/100ML IV EMUL
0.0000 ug/kg/min | INTRAVENOUS | Status: DC
Start: 2023-05-13 — End: 2023-05-13
  Administered 2023-05-13: 5 ug/kg/min via INTRAVENOUS
  Filled 2023-05-13: qty 100

## 2023-05-13 MED ORDER — POTASSIUM CHLORIDE 10 MEQ/100ML IV SOLN: 10.0000 meq | INTRAVENOUS | Status: DC

## 2023-05-13 MED ORDER — SODIUM CHLORIDE 0.9% FLUSH: 10.0000 mL | INTRAVENOUS | Status: DC | PRN

## 2023-05-13 MED ORDER — LINEZOLID 600 MG/300ML IV SOLN
600.0000 mg | Freq: Two times a day (BID) | INTRAVENOUS | Status: DC
  Filled 2023-05-13: qty 300

## 2023-05-13 MED ORDER — SODIUM CHLORIDE 0.9 % IV SOLN
200.0000 mg | Freq: Once | INTRAVENOUS | Status: AC
  Administered 2023-05-13: 200 mg via INTRAVENOUS
  Filled 2023-05-13: qty 40

## 2023-05-13 MED ORDER — POTASSIUM CHLORIDE 10 MEQ/100ML IV SOLN
10.0000 meq | INTRAVENOUS | Status: DC
  Filled 2023-05-13 (×4): qty 100

## 2023-05-13 MED ORDER — SODIUM BICARBONATE 8.4 % IV SOLN
100.0000 meq | Freq: Once | INTRAVENOUS | Status: AC
  Administered 2023-05-13: 100 meq via INTRAVENOUS
  Filled 2023-05-13: qty 50

## 2023-05-13 MED ORDER — SODIUM CHLORIDE 0.9 % IV SOLN
250.0000 mg | Freq: Once | INTRAVENOUS | Status: AC
  Administered 2023-05-13: 250 mg via INTRAVENOUS
  Filled 2023-05-13: qty 250

## 2023-05-13 MED ORDER — VANCOMYCIN HCL 1750 MG/350ML IV SOLN: 1750.0000 mg | INTRAVENOUS | Status: DC

## 2023-05-13 MED ORDER — SODIUM CHLORIDE 0.9% FLUSH
10.0000 mL | Freq: Two times a day (BID) | INTRAVENOUS | Status: DC
  Administered 2023-05-13: 10 mL

## 2023-05-13 MED ORDER — PHENYLEPHRINE HCL-NACL 20-0.9 MG/250ML-% IV SOLN
0.0000 ug/min | INTRAVENOUS | Status: DC
  Administered 2023-05-13: 20 ug/min via INTRAVENOUS
  Administered 2023-05-13: 310 ug/min via INTRAVENOUS
  Filled 2023-05-13 (×3): qty 250

## 2023-05-13 MED ORDER — SODIUM CHLORIDE 0.9 % IV SOLN
2.0000 g | Freq: Two times a day (BID) | INTRAVENOUS | Status: DC
  Administered 2023-05-13: 2 g via INTRAVENOUS
  Filled 2023-05-13 (×2): qty 12.5

## 2023-05-13 MED ORDER — ETOMIDATE 2 MG/ML IV SOLN
INTRAVENOUS | Status: AC
  Administered 2023-05-13: 10 mg via INTRAVENOUS
  Filled 2023-05-13: qty 20

## 2023-05-13 MED ORDER — HEPARIN SODIUM (PORCINE) 1000 UNIT/ML DIALYSIS: 1000.0000 [IU] | INTRAMUSCULAR | Status: DC | PRN

## 2023-05-13 MED ORDER — ALBUTEROL (5 MG/ML) CONTINUOUS INHALATION SOLN
10.0000 mg/h | INHALATION_SOLUTION | RESPIRATORY_TRACT | Status: AC
  Filled 2023-05-13: qty 20

## 2023-05-13 MED ORDER — POTASSIUM CHLORIDE 10 MEQ/50ML IV SOLN
10.0000 meq | INTRAVENOUS | Status: AC
  Administered 2023-05-13 (×6): 10 meq via INTRAVENOUS
  Filled 2023-05-13 (×6): qty 50

## 2023-05-13 MED ORDER — TRANEXAMIC ACID-NACL 1000-0.7 MG/100ML-% IV SOLN: 1000.0000 mg | Freq: Once | INTRAVENOUS | Status: DC

## 2023-05-13 MED ORDER — PRISMASOL BGK 4/2.5 32-4-2.5 MEQ/L EC SOLN: Status: DC

## 2023-05-13 MED ORDER — TRANEXAMIC ACID FOR INHALATION
500.0000 mg | Freq: Three times a day (TID) | RESPIRATORY_TRACT | Status: DC
  Administered 2023-05-13: 500 mg via RESPIRATORY_TRACT
  Filled 2023-05-13 (×3): qty 10

## 2023-05-13 MED ORDER — PHENYLEPHRINE CONCENTRATED 100MG/250ML (0.4 MG/ML) INFUSION SIMPLE
0.0000 ug/min | INTRAVENOUS | Status: DC
  Administered 2023-05-13: 280 ug/min via INTRAVENOUS
  Filled 2023-05-13 (×4): qty 250

## 2023-05-13 MED ORDER — METHYLENE BLUE (ANTIDOTE) 1 % IV SOLN
0.5000 mg/kg/h | INTRAVENOUS | Status: DC
  Administered 2023-05-13: 0.5 mg/kg/h via INTRAVENOUS
  Filled 2023-05-13: qty 25

## 2023-05-13 MED ORDER — ROCURONIUM BROMIDE 10 MG/ML (PF) SYRINGE
PREFILLED_SYRINGE | INTRAVENOUS | Status: AC
  Filled 2023-05-13: qty 10

## 2023-05-13 MED ORDER — MIDAZOLAM HCL 2 MG/2ML IJ SOLN: 4.0000 mg | INTRAMUSCULAR | Status: DC | PRN

## 2023-05-13 MED ORDER — DOCUSATE SODIUM 50 MG/5ML PO LIQD
100.0000 mg | Freq: Two times a day (BID) | ORAL | Status: DC
  Filled 2023-05-13: qty 10

## 2023-05-13 MED ORDER — VITAMIN K1 10 MG/ML IJ SOLN
10.0000 mg | Freq: Once | INTRAVENOUS | Status: AC
  Administered 2023-05-13: 10 mg via INTRAVENOUS
  Filled 2023-05-13: qty 1

## 2023-05-13 MED ORDER — POLYETHYLENE GLYCOL 3350 17 G PO PACK
17.0000 g | PACK | Freq: Every day | ORAL | Status: DC
  Filled 2023-05-13: qty 1

## 2023-05-13 MED ORDER — FENTANYL 2500MCG IN NS 250ML (10MCG/ML) PREMIX INFUSION
50.0000 ug/h | INTRAVENOUS | Status: DC
  Administered 2023-05-13 (×2): 50 ug/h via INTRAVENOUS
  Filled 2023-05-13: qty 250

## 2023-05-13 MED ORDER — MUPIROCIN 2 % EX OINT
1.0000 | TOPICAL_OINTMENT | Freq: Two times a day (BID) | CUTANEOUS | Status: DC
  Administered 2023-05-13: 1 via NASAL
  Filled 2023-05-13: qty 22

## 2023-05-13 MED ORDER — SODIUM BICARBONATE 8.4 % IV SOLN
INTRAVENOUS | Status: AC
  Filled 2023-05-13: qty 100

## 2023-05-13 MED ORDER — MIDAZOLAM 50MG/50ML (1MG/ML) PREMIX INFUSION
0.5000 mg/h | INTRAVENOUS | Status: DC
  Administered 2023-05-13: 0.5 mg/h via INTRAVENOUS
  Filled 2023-05-13: qty 50

## 2023-05-13 MED ORDER — SODIUM BICARBONATE 8.4 % IV SOLN
INTRAVENOUS | Status: DC
  Filled 2023-05-13: qty 1000

## 2023-05-13 MED ORDER — ORAL CARE MOUTH RINSE
15.0000 mL | OROMUCOSAL | Status: DC
  Administered 2023-05-13 (×3): 15 mL via OROMUCOSAL

## 2023-05-13 MED ORDER — ORAL CARE MOUTH RINSE: 15.0000 mL | OROMUCOSAL | Status: DC | PRN

## 2023-05-13 MED ORDER — TRANEXAMIC ACID 1000 MG/10ML IV SOLN: 1000.0000 mg | Freq: Once | INTRAVENOUS | Status: DC

## 2023-05-13 MED ORDER — VANCOMYCIN HCL 1750 MG/350ML IV SOLN
1750.0000 mg | Freq: Once | INTRAVENOUS | Status: AC
  Administered 2023-05-13: 1750 mg via INTRAVENOUS
  Filled 2023-05-13: qty 350

## 2023-05-13 MED ORDER — MAGNESIUM SULFATE 2 GM/50ML IV SOLN
2.0000 g | Freq: Once | INTRAVENOUS | Status: AC
  Administered 2023-05-13: 2 g via INTRAVENOUS
  Filled 2023-05-13: qty 50

## 2023-05-13 MED ORDER — FENTANYL CITRATE PF 50 MCG/ML IJ SOSY: 50.0000 ug | PREFILLED_SYRINGE | Freq: Once | INTRAMUSCULAR | Status: DC

## 2023-05-13 MED ORDER — VASOPRESSIN 20 UNITS/100 ML INFUSION FOR SHOCK
0.0000 [IU]/min | INTRAVENOUS | Status: DC
  Administered 2023-05-13 (×3): 0.04 [IU]/min via INTRAVENOUS
  Filled 2023-05-13 (×4): qty 100

## 2023-05-13 MED ORDER — ALBUTEROL SULFATE (2.5 MG/3ML) 0.083% IN NEBU
INHALATION_SOLUTION | RESPIRATORY_TRACT | Status: AC
  Administered 2023-05-13: 10 mg
  Filled 2023-05-13: qty 12

## 2023-05-13 MED ORDER — HYDROCORTISONE SOD SUC (PF) 100 MG IJ SOLR
50.0000 mg | Freq: Three times a day (TID) | INTRAMUSCULAR | Status: DC
  Administered 2023-05-13: 50 mg via INTRAVENOUS
  Filled 2023-05-13: qty 2

## 2023-05-14 LAB — LEGIONELLA PNEUMOPHILA SEROGP 1 UR AG: L. pneumophila Serogp 1 Ur Ag: NEGATIVE

## 2023-05-14 LAB — ANA COMPREHENSIVE PANEL
Anti JO-1: <0.2 AI (ref 0.0–0.9)
Centromere Ab Screen: <0.2 AI (ref 0.0–0.9)
Chromatin Ab SerPl-aCnc: <0.2 AI (ref 0.0–0.9)
ENA SM Ab Ser-aCnc: <0.2 AI (ref 0.0–0.9)
Ribonucleic Protein: <0.2 AI (ref 0.0–0.9)
SSA (Ro) (ENA) Antibody, IgG: 0.2 AI (ref 0.0–0.9)
SSB (La) (ENA) Antibody, IgG: <0.2 AI (ref 0.0–0.9)
Scleroderma (Scl-70) (ENA) Antibody, IgG: <0.2 AI (ref 0.0–0.9)
ds DNA Ab: <1 [IU]/mL (ref 0–9)

## 2023-05-18 LAB — CULTURE, BLOOD (ROUTINE X 2)
Culture: NO GROWTH
Culture: NO GROWTH

## 2023-06-05 NOTE — ED Provider Notes (Signed)
 [facility] Department of Emergency Medicine   Code Blue CONSULT NOTE  Chief Complaint: Cardiac arrest/unresponsive   Level V Caveat: Unresponsive  History of present illness: I was contacted by the hospital for a CODE BLUE cardiac arrest upstairs and presented to the patient's bedside.   Code Blue called for patient with hemoptysis, difficult intubation.   ROS: Unable to obtain, Level V caveat  Scheduled Meds:  heparin  5,000 Units Subcutaneous Q8H   insulin aspart  0-15 Units Subcutaneous Q4H   mouth rinse  15 mL Mouth Rinse 4 times per day   rocuronium       rocuronium  50 mg Intravenous Once   Continuous Infusions:  azithromycin Stopped (05/12/23 2333)   dexmedetomidine     dexmedetomidine (PRECEDEX) IV infusion     lactated ringers 125 mL/hr at 05/12/23 2350   norepinephrine (LEVOPHED) Adult infusion 2 mcg/min (05/12/23 2221)   potassium chloride 10 mEq (05/12/23 2356)   PRN Meds:.dexmedetomidine, docusate sodium, morphine injection, mouth rinse, polyethylene glycol, rocuronium Past Medical History:  Diagnosis Date   Asthma    Hypertension    No past surgical history on file. Social History   Socioeconomic History   Marital status: Divorced    Spouse name: Not on file   Number of children: Not on file   Years of education: Not on file   Highest education level: Not on file  Occupational History   Not on file  Tobacco Use   Smoking status: Former   Smokeless tobacco: Not on file  Vaping Use   Vaping status: Never Used  Substance and Sexual Activity   Alcohol use: Yes   Drug use: Not Currently   Sexual activity: Not on file  Other Topics Concern   Not on file  Social History Narrative   Not on file   Social Drivers of Health   Financial Resource Strain: Not on file  Food Insecurity: Not on file  Transportation Needs: Not on file  Physical Activity: Not on file  Stress: Not on file  Social Connections: Not on file  Intimate  Partner Violence: Not on file   Allergies  Allergen Reactions   Sulfa Antibiotics Nausea And Vomiting    Last set of Vital Signs (not current) Vitals:   05/12/23 2245 05/12/23 2330  BP: (!) 118/95   Pulse: (!) 117   Resp: (!) 25   Temp:  (!) 97.3 F (36.3 C)  SpO2: 100%       Physical Exam  Gen: unresponsive Cardiovascular: +pulses Resp: ET tube inserted with minimal color change Abd: distended  Neuro: GCS 3, unresponsive to pain  HEENT: Blood in posterior pharynx, gag reflex absent  Neck: No crepitus  Musculoskeletal: No deformity  Skin: warm  Procedures (when applicable, including Critical Care time): Procedure Name: Intubation Date/Time: 05/22/2023 12:48 AM  Performed by: Irean Hong, MDPre-anesthesia Checklist: Patient identified, Patient being monitored, Emergency Drugs available and Suction available Oxygen Delivery Method: Ambu bag Laryngoscope Size: Glidescope and 3 Grade View: Grade III Tube size: 7.5 mm Number of attempts: 1 Airway Equipment and Method: Rigid stylet       MDM / Assessment and Plan 48 year old female admitted for COVID-pneumonia.  CODE BLUE called for difficult, unsuccessful intubation due to hemoptysis.  Patient intubated at bedside.  Care assumed by CCU NP.   Irean Hong, MD 05/15/2023 [phone]

## 2023-06-05 NOTE — ED Notes (Signed)
 Pt made RN aware that she had soiled her adult briefs. Pt noted to have loose bowel movement. RN cleaned pt thoroughly with wipes and provided pt with new gown, linens, and padding. Pt repositioned

## 2023-06-05 NOTE — ED Notes (Signed)
 After handing pt off to ICU, offsite telemetry unit called and made aware that the pt had been moved to room 17 for continuous cardiac monitoring.

## 2023-06-05 NOTE — Progress Notes (Signed)
 Dr. Belia Heman and NP aware of patients labs. Patient will be getting line for CRRT, per family meeting patient is a DNR. Continue to assess.

## 2023-06-05 NOTE — Procedures (Signed)
 Arterial Catheter Insertion Procedure Note  Caitlin Nicholson  [id number]  31-Dec-1975  Date:05/28/2023  Time:5:29 AM    Provider Performing: Loraine Leriche   Procedure: Insertion of Arterial Line (78295) with US guidance (62130)   Indication(s) Blood pressure monitoring and/or need for frequent ABGs  Consent Risks of the procedure as well as the alternatives and risks of each were explained to the patient and/or caregiver.  Consent for the procedure was obtained and is signed in the bedside chart  Anesthesia None  Time Out Verified patient identification, verified procedure, site/side was marked, verified correct patient position, special equipment/implants available, medications/allergies/relevant history reviewed, required imaging and test results available.  Sterile Technique Maximal sterile technique including full sterile barrier drape, hand hygiene, sterile gown, sterile gloves, mask, hair covering, sterile ultrasound probe cover (if used).  Procedure Description Area of catheter insertion was cleaned with chlorhexidine and draped in sterile fashion. With real-time ultrasound guidance an arterial catheter was placed into the right femoral artery.  Appropriate arterial tracings confirmed on monitor.    Complications/Tolerance None; patient tolerated the procedure well.  EBL Minimal  Specimen(s) None   [name], DNP, CCRN, FNP-C, AGACNP-BC Acute Care & Family Nurse Practitioner  [facility]  See Amion for personal pager PCCM on call pager [phone] until 7 am

## 2023-06-05 NOTE — Consult Note (Signed)
 [facility] CONSULT NOTE    Date: 05/20/2023                  Patient Name:  Caitlin Nicholson  MRN: [id number]  DOB: [birth year]  Age / Sex: 48 y.o., female         PCP: System, Provider Not In                 Service Requesting Consult: Critical care                 Reason for Consult: Severe acute kidney injury, acute metabolic acidosis            History of Present Illness: Patient is a 47 y.o. female with a PMHx of asthma, anxiety/depression, opioid use, who was admitted to [facility] on 05/12/2023 for evaluation of shortness of breath, productive cough, myalgia, nausea, and vomiting.  Patient was unable to provide any history at the time of our evaluation earlier this afternoon due to critical illness.  Patient diagnosed with COVID-pneumonia and multiorgan failure.  We were asked to evaluate her for evaluation management of continuous renal replacement therapy.  Earlier in the day the patient's BUN was 30 with a creatinine of 2.37.  She had developed oliguria.  She also had significant acute metabolic acidosis as her lactic acid was greater than 9 and serum bicarbonate was 18.  ABG showed pH of 7.05, PCO264, PO260.  Temporary IJ catheter was placed for the purposes of CRRT.  Patient's family was also at the bedside.   Medications: Outpatient medications: No medications prior to admission.    Current medications: Current Facility-Administered Medications  Medication Dose Route Frequency Provider Last Rate Last Admin   azithromycin (ZITHROMAX) 500 mg in sodium chloride 0.9 % 250 mL IVPB  500 mg Intravenous Q24H Concha Se, MD   Stopped at 05/12/23 2325   budesonide (PULMICORT) nebulizer solution 0.5 mg  0.5 mg Nebulization BID Erin Fulling, MD   0.5 mg at 05/25/2023 0819   ceFEPIme (MAXIPIME) 2 g in sodium chloride 0.9 % 100 mL IVPB  2 g Intravenous Q12H Erin Fulling, MD   Stopped at 05/29/2023 0957   Chlorhexidine Gluconate Cloth 2 % PADS 6 each  6 each Topical Daily  Jimmye Norman, NP   6 each at 05/19/2023 0404   dexmedetomidine (PRECEDEX) 400 MCG/100ML (4 mcg/mL) infusion  0-1.2 mcg/kg/hr Intravenous Titrated Jimmye Norman, NP       docusate (COLACE) 50 MG/5ML liquid 100 mg  100 mg Per Tube BID Erin Fulling, MD       docusate sodium (COLACE) capsule 100 mg  100 mg Oral BID PRN Jimmye Norman, NP       EPINEPHrine (ADRENALIN) 5 mg in NS 250 mL (0.02 mg/mL) premix infusion  0.5-20 mcg/min Intravenous Titrated Jimmye Norman, NP   Stopped at 05/11/2023 0445   [START ON 05/14/2023] famotidine (PEPCID) IVPB 20 mg premix  20 mg Intravenous Q24H Nazari, Walid A, RPH       fentaNYL (SUBLIMAZE) bolus via infusion 50-100 mcg  50-100 mcg Intravenous Q15 min PRN Erin Fulling, MD       fentaNYL (SUBLIMAZE) injection 50 mcg  50 mcg Intravenous Once Erin Fulling, MD       fentaNYL in NS (37mcg/ml) infusion-PREMIX  50-200 mcg/hr Intravenous Continuous Erin Fulling, MD   Stopped at 05/25/2023 1511   heparin injection 1,000-6,000 Units  1,000-6,000 Units CRRT PRN Wendee Beavers, NP  hydrocortisone sodium succinate (SOLU-CORTEF) 100 MG injection 50 mg  50 mg Intravenous Q8H Jimmye Norman, NP   50 mg at 06/04/2023 1021   insulin aspart (novoLOG) injection 0-15 Units  0-15 Units Subcutaneous Q4H Jimmye Norman, NP   2 Units at 05/07/2023 1120   ipratropium-albuterol (DUONEB) 0.5-2.5 (3) MG/3ML nebulizer solution 3 mL  3 mL Nebulization Q4H Erin Fulling, MD   3 mL at 05/30/2023 1225   lactated ringers infusion   Intravenous Continuous Jimmye Norman, NP   Stopped at 05/20/2023 0226   linezolid (ZYVOX) IVPB 600 mg  600 mg Intravenous Q12H Erin Fulling, MD       methylene blue (antidote) 250 mg in dextrose 5 % 250 mL (1 mg/mL) infusion  0.5 mg/kg/hr Intravenous Continuous Erin Fulling, MD   Stopped at 05/08/2023 1604   midazolam (VERSED) 50 mg/50 mL (1 mg/mL) premix infusion  0.5-10 mg/hr Intravenous Continuous  Jimmye Norman, NP   Stopped at 05/30/2023 1511   midazolam (VERSED) injection 4 mg  4 mg Intravenous Q15 min PRN Erin Fulling, MD       morphine (PF) 2 MG/ML injection 2 mg  2 mg Intravenous Q4H PRN Jimmye Norman, NP   2 mg at 05/12/23 2338   mupirocin ointment (BACTROBAN) 2 % 1 Application  1 Application Nasal BID Jimmye Norman, NP   1 Application at 05/09/2023 [phone]   norepinephrine (LEVOPHED) 16 mg in (0.064 mg/mL) premix infusion  0-400 mcg/min Intravenous Titrated Erin Fulling, MD   Stopped at 05/29/2023 1604   Oral care mouth rinse  15 mL Mouth Rinse Q2H Jimmye Norman, NP   15 mL at 06/02/2023 1404   Oral care mouth rinse  15 mL Mouth Rinse PRN Jimmye Norman, NP       phenylephrine CONCENTRATED 100mg  in sodium chloride 0.9% (0.4mg /mL) premix infusion  0-400 mcg/min Intravenous Titrated Erin Fulling, MD   Stopped at 05/23/2023 1604   polyethylene glycol (MIRALAX / GLYCOLAX) packet 17 g  17 g Oral Daily PRN Jimmye Norman, NP       polyethylene glycol (MIRALAX / GLYCOLAX) packet 17 g  17 g Per Tube Daily Erin Fulling, MD       prismasol BGK 4/2.5 infusion   CRRT Continuous Wendee Beavers, NP 400 mL/hr at 05/10/2023 1515 New Bag at 05/09/2023 1515   prismasol BGK 4/2.5 infusion   CRRT Continuous Wendee Beavers, NP 400 mL/hr at 06/04/2023 1515 New Bag at 06/02/2023 1515   prismasol BGK 4/2.5 infusion   CRRT Continuous Wendee Beavers, NP 1,500 mL/hr at 06/01/2023 1515 New Bag at 05/23/2023 1515   [START ON 05/14/2023] remdesivir 100 mg in sodium chloride 0.9 % 100 mL IVPB  100 mg Intravenous Daily Erin Fulling, MD       sodium bicarbonate 150 mEq in sterile water 1,150 mL infusion   Intravenous Continuous Paliwal, Aditya, MD   Stopped at 05/23/2023 1604   sodium chloride flush (NS) 0.9 % injection 10-40 mL  10-40 mL Intracatheter Q12H Jimmye Norman, NP   10 mL at 06/04/2023 0950   sodium chloride flush (NS) 0.9 % injection 10-40 mL  10-40  mL Intracatheter PRN Jimmye Norman, NP       tranexamic acid (CYKLOKAPRON) 1000 MG/10ML nebulizer solution 500 mg  500 mg Nebulization Q8H Kasa, Kurian, MD   500 mg at 05/18/2023 0831   vasopressin (PITRESSIN) 20 Units in 100 mL (0.2 unit/mL) infusion-*FOR SHOCK*  0-0.04  Units/min Intravenous Continuous Jimmye Norman, NP   Stopped at 05/10/2023 1604   vecuronium (NORCURON) injection 10 mg  10 mg Intravenous Q15 min PRN Erin Fulling, MD       vecuronium (NORCURON) injection 10 mg  10 mg Intravenous Q1H PRN Jimmye Norman, NP       Current Outpatient Medications  Medication Sig Dispense Refill   albuterol (PROVENTIL HFA;VENTOLIN HFA) 108 (90 BASE) MCG/ACT inhaler Inhale 2 puffs into the lungs every 4 (four) hours as needed for wheezing or shortness of breath (using q every hour).     albuterol (PROVENTIL) (2.5 MG/3ML) 0.083% nebulizer solution Take 2.5 mg by nebulization every 6 (six) hours as needed for wheezing or shortness of breath.     buprenorphine-naloxone (SUBOXONE) 8-2 mg SUBL SL tablet BUPRENORPHINE HCL-NALOXONE HCL 8-2 MG SUBL     montelukast (SINGULAIR) 10 MG tablet Take 10 mg by mouth daily.     rizatriptan (MAXALT-MLT) 10 MG disintegrating tablet Take by mouth.     venlafaxine XR (EFFEXOR-XR) 150 MG 24 hr capsule Take 150 mg by mouth daily with breakfast.     verapamil (CALAN-SR) 180 MG CR tablet Take 360 mg by mouth at bedtime.     brompheniramine-pseudoephedrine-DM 30-2-10 MG/5ML syrup Take 5 mLs by mouth 4 (four) times daily as needed. (Patient not taking: Reported on 05/12/2023) 120 mL 0   molnupiravir EUA (LAGEVRIO) 200 mg CAPS capsule Take 4 capsules (800 mg total) by mouth 2 (two) times daily for 5 days. (Patient not taking: Reported on 05/12/2023) 40 capsule 0   predniSONE (STERAPRED UNI-PAK 21 TAB) 10 MG (21) TBPK tablet Take 1 tablet (10 mg total) by mouth daily. Taper over 6 days. (Patient not taking: Reported on 05/12/2023) 21 tablet 0   traZODone  (DESYREL) 100 MG tablet Take 100 mg by mouth at bedtime. (Patient not taking: Reported on 05/12/2023)        Allergies: Allergies  Allergen Reactions   Sulfa Antibiotics Nausea And Vomiting      Past Medical History: Past Medical History:  Diagnosis Date   Asthma    Hypertension      Past Surgical History: No past surgical history on file.   Family History: No family history on file.   Social History: Social History   Socioeconomic History   Marital status: Divorced    Spouse name: Not on file   Number of children: Not on file   Years of education: Not on file   Highest education level: Not on file  Occupational History   Not on file  Tobacco Use   Smoking status: Former   Smokeless tobacco: Not on file  Vaping Use   Vaping status: Never Used  Substance and Sexual Activity   Alcohol use: Yes   Drug use: Not Currently   Sexual activity: Not on file  Other Topics Concern   Not on file  Social History Narrative   Not on file   Social Drivers of Health   Financial Resource Strain: Not on file  Food Insecurity: Not on file  Transportation Needs: Not on file  Physical Activity: Not on file  Stress: Not on file  Social Connections: Not on file  Intimate Partner Violence: Not on file     Review of Systems: Patient unable to provide any review of systems as the patient was intubated  Vital Signs: Blood pressure 136/86, pulse (!) 102, temperature 98.4 F (36.9 C), resp. rate (!) 0, height 5\' 5"  (1.651 m),  weight 77 kg, SpO2 (!) 81%.  Weight trends: Filed Weights   05/12/23 2330 05/17/2023 0445  Weight: 76.1 kg 77 kg     Physical Exam: General: Critically ill-appearing  Head: Normocephalic, atraumatic.  Endotracheal tube in place  Eyes: Anicteric  Neck: Supple  Lungs:  Vent assisted, FiO2 100%  Heart: S1S2 no rubs  Abdomen:  Soft, nontender, bowel sounds present  Extremities: Trace peripheral edema.  Neurologic: Intubated, not following commands   Skin: Diffuse mottling of skin  Access: Right IJ temporary dialysis catheter    Lab results: Basic Metabolic Panel: Recent Labs  Lab 05/12/23 2108 05/27/2023 0237 05/11/2023 0252 05/07/2023 0943  NA 132* 137  --  134*  K 2.4* 2.7*  --  4.0  CL 95* 100  --  98  CO2 16* 19*  --  18*  GLUCOSE 286* 201*  --  110*  BUN 23* 24*  --  30*  CREATININE 2.25* 1.81*  --  2.37*  CALCIUM 8.0* 6.5*  --  6.0*  MG  --   --  1.8  --     Liver Function Tests: Recent Labs  Lab 05/12/23 2108 05/07/2023 0237 05/23/2023 0943  AST 73* 130* 173*  ALT 48* 66* 53*  ALKPHOS 52 28* 34*  BILITOT 1.5* 0.7 1.0  PROT 6.3* 3.6* 3.2*  ALBUMIN 2.8* 1.6* <1.5*   No results for input(s): "LIPASE", "AMYLASE" in the last 168 hours. No results for input(s): "AMMONIA" in the last 168 hours.  CBC: Recent Labs  Lab 05/12/23 2011 05/26/2023 0254  WBC 0.9* 0.9*  NEUTROABS 0.4*  --   HGB 17.6* 13.9  HCT 52.6* 41.6  MCV 83.5 83.2  PLT 164 85*    Cardiac Enzymes: No results for input(s): "CKTOTAL", "CKMB", "CKMBINDEX", "TROPONINI" in the last 168 hours.  BNP: Invalid input(s): "POCBNP"  CBG: Recent Labs  Lab 05/12/23 2330 05/06/2023 0119 05/17/2023 0800 05/21/2023 1108  GLUCAP 210* 237* 123* 124*    Microbiology: Results for orders placed or performed during the hospital encounter of 05/12/23  Blood culture (routine x 2)     Status: None (Preliminary result)   Collection Time: 06/02/2023 12:10 AM   Specimen: BLOOD  Result Value Ref Range Status   Specimen Description BLOOD BLOOD RIGHT ARM  Final   Special Requests   Final    BOTTLES DRAWN AEROBIC AND ANAEROBIC Blood Culture results may not be optimal due to an inadequate volume of blood received in culture bottles   Culture   Final    NO GROWTH < 12 HOURS Performed at [facility], [address], [place], [place] [address]    Report Status PENDING  Incomplete  Blood culture (routine x 2)     Status: None (Preliminary result)   Collection  Time: 05/11/2023 12:10 AM   Specimen: BLOOD  Result Value Ref Range Status   Specimen Description BLOOD BLOOD RIGHT HAND  Final   Special Requests   Final    BOTTLES DRAWN AEROBIC AND ANAEROBIC Blood Culture results may not be optimal due to an inadequate volume of blood received in culture bottles   Culture   Final    NO GROWTH < 12 HOURS Performed at [facility], [address] [place], [place] [address]    Report Status PENDING  Incomplete  MRSA Next Gen by PCR, Nasal     Status: Abnormal   Collection Time: 06/04/2023  3:20 AM   Specimen: Nasal Mucosa; Nasal Swab  Result Value  Ref Range Status   MRSA by PCR Next Gen DETECTED (A) NOT DETECTED Final    Comment: RESULT CALLED TO, READ BACK BY AND VERIFIED WITH:  Carollee Herter DOUGHERTY AT 0518 05/30/2023 JG (NOTE) The GeneXpert MRSA Assay (FDA approved for NASAL specimens only), is one component of a comprehensive MRSA colonization surveillance program. It is not intended to diagnose MRSA infection nor to guide or monitor treatment for MRSA infections. Test performance is not FDA approved in patients less than 57 years old. Performed at [facility] [address] [place], [place] [address]     Coagulation Studies: Recent Labs    05/08/2023 0237  LABPROT 22.0*  INR 1.9*    Urinalysis: No results for input(s): "COLORURINE", "LABSPEC", "PHURINE", "GLUCOSEU", "HGBUR", "BILIRUBINUR", "KETONESUR", "PROTEINUR", "UROBILINOGEN", "NITRITE", "LEUKOCYTESUR" in the last 72 hours.  Invalid input(s): "APPERANCEUR"    Imaging: DG Chest Port 1 View Result Date: 05/28/2023 CLINICAL DATA:  Central line placement. EXAM: PORTABLE CHEST 1 VIEW COMPARISON:  Same day. FINDINGS: Interval placement of right internal jugular catheter with distal tip in expected position of cavoatrial junction. No definite pneumothorax is noted. Otherwise stable support apparatus. Stable bilateral lung opacities as noted on prior exam. IMPRESSION: Interval  placement of right internal jugular catheter with distal tip in expected position of cavoatrial junction Electronically Signed   By: Lupita Raider M.D.   On: 05/08/2023 14:58   DG Chest Port 1 View Result Date: 05/26/2023 CLINICAL DATA:  Respiratory failure. EXAM: PORTABLE CHEST 1 VIEW COMPARISON:  Same day. FINDINGS: Endotracheal and nasogastric tubes are unchanged. Left internal jugular catheter is unchanged. Elevated right hemidiaphragm is noted. Bilateral lung opacities are noted concerning for edema or pneumonia. Bony thorax is unremarkable. IMPRESSION: Stable support apparatus. Bilateral lung opacities are again noted concerning for pneumonia or edema. Electronically Signed   By: Lupita Raider M.D.   On: 05/09/2023 11:50   DG Chest Port 1 View Result Date: 05/07/2023 CLINICAL DATA:  Evaluate central venous catheter placement EXAM: PORTABLE CHEST 1 VIEW COMPARISON:  05/29/2023 FINDINGS: ET tube tip is 6.9 cm above the carina. There is an enteric tube with tip coursing below the GE junction. Left IJ central venous catheter is identified with tip in the projection of the superior cavoatrial junction. No pneumothorax identified. Unchanged cardiomediastinal contours. Persistent bilateral mid and lower lung zone airspace consolidation. This is not significantly changed in the interval. IMPRESSION: 1. Satisfactory position of left IJ central venous catheter with tip in the projection of the superior cavoatrial junction. No pneumothorax. 2. Unchanged bilateral mid and lower lung zone airspace consolidation. Electronically Signed   By: Signa Kell M.D.   On: 05/27/2023 04:59   DG Chest Port 1 View Result Date: 05/10/2023 CLINICAL DATA:  Post intubation and OG tube placement. Acute respiratory failure, code blue, covid-19 positive. EXAM: PORTABLE CHEST 1 VIEW COMPARISON:  CT chest abdomen pelvis 05/12/2023, chest x-ray 05/12/2023 FINDINGS: Endotracheal tube 8-9 cm above the carina. Enteric tube courses  below the diaphragm with tip and side port overlying the expected region of the gastric lumen. Cardiac paddles overlie the chest. The heart and mediastinal contours are unchanged. Interval worsening of bilateral patchy airspace opacities most prominent along the mid to lower lung zones. No pulmonary edema. No definite pleural effusion. No pneumothorax. Gaseous distension of the visualized small and large bowel. No acute osseous abnormality. IMPRESSION: 1. Interval worsening of bilateral patchy airspace opacities most prominent along the mid to lower lung zones. 2. Endotracheal tube  8-9 cm above the carina. Recommend advancing by 3 cm. 3. Enteric tube in good position. 4.  Gaseous distension of the visualized small and large bowel. 5. 6. These results will be called to the ordering clinician or representative by the Radiologist Assistant, and communication documented in the PACS or [employer]. Electronically Signed   By: Tish Frederickson M.D.   On: 05/22/2023 02:35   DG Abd 1 View Result Date: 05/24/2023 CLINICAL DATA:  Post intubation and OG tube placement. Acute respiratory failure, code blue, covid-19 positive. EXAM: PORTABLE CHEST 1 VIEW COMPARISON:  CT chest abdomen pelvis 05/12/2023, chest x-ray 05/12/2023 FINDINGS: Endotracheal tube 8-9 cm above the carina. Enteric tube courses below the diaphragm with tip and side port overlying the expected region of the gastric lumen. Cardiac paddles overlie the chest. The heart and mediastinal contours are unchanged. Interval worsening of bilateral patchy airspace opacities most prominent along the mid to lower lung zones. No pulmonary edema. No definite pleural effusion. No pneumothorax. Gaseous distension of the visualized small and large bowel. No acute osseous abnormality. IMPRESSION: 1. Interval worsening of bilateral patchy airspace opacities most prominent along the mid to lower lung zones. 2. Endotracheal tube 8-9 cm above the carina. Recommend advancing by  3 cm. 3. Enteric tube in good position. 4.  Gaseous distension of the visualized small and large bowel. 5. 6. These results will be called to the ordering clinician or representative by the Radiologist Assistant, and communication documented in the PACS or [employer]. Electronically Signed   By: Tish Frederickson M.D.   On: 05/14/2023 02:35   CT CHEST ABDOMEN PELVIS WO CONTRAST Result Date: 05/24/2023 CLINICAL DATA:  Sepsis. Patient coded. Copious amount of blood coming from mouth/nodes. Diagnosed with Covid yesterday, SOB today, found with O2 saturation of 82% RA. Gilbert at 6 EXAM: CT CHEST, ABDOMEN AND PELVIS WITHOUT CONTRAST TECHNIQUE: Multidetector CT imaging of the chest, abdomen and pelvis was performed following the standard protocol without IV contrast. RADIATION DOSE REDUCTION: This exam was performed according to the departmental dose-optimization program which includes automated exposure control, adjustment of the mA and/or kV according to patient size and/or use of iterative reconstruction technique. COMPARISON:  Chest x-ray 05/12/2023. chest x-ray 06/14/2014 FINDINGS: CT CHEST FINDINGS Cardiovascular: Normal heart size. No significant pericardial effusion. The thoracic aorta is normal in caliber. No atherosclerotic plaque of the thoracic aorta. No coronary artery calcifications. Mediastinum/Nodes: No gross hilar adenopathy, noting limited sensitivity for the detection of hilar adenopathy on this noncontrast study. No enlarged mediastinal or axillary lymph nodes. Thyroid gland, trachea, and esophagus demonstrate no significant findings. Lungs/Pleura: Diffuse, bilateral lower lung zone predominant, peribronchovascular patchy ground-glass and consolidative airspace opacities. No pleural effusion. No pneumothorax. Musculoskeletal: No chest wall abnormality. No suspicious lytic or blastic osseous lesions. No acute displaced fracture. CT ABDOMEN PELVIS FINDINGS Hepatobiliary: No focal liver abnormality. No  gallstones, gallbladder wall thickening, or pericholecystic fluid. No biliary dilatation. Pancreas: Diffusely atrophic. No focal lesion. Otherwise normal pancreatic contour. No surrounding inflammatory changes. No main pancreatic ductal dilatation. Spleen: Normal in size without focal abnormality. Adrenals/Urinary Tract: No adrenal nodule bilaterally. Question nephrocalcinosis. Punctate right nephrolithiasis. No hydronephrosis. No definite contour-deforming renal mass. No ureterolithiasis or hydroureter. The urinary bladder is unremarkable. Stomach/Bowel: Stomach is within normal limits. No evidence of bowel wall thickening or dilatation. The appendix is not definitely identified with no inflammatory changes in the right lower quadrant to suggest acute appendicitis. Vascular/Lymphatic: No abdominal aorta or iliac aneurysm. Mild atherosclerotic plaque of the  aorta and its branches. No abdominal, pelvic, or inguinal lymphadenopathy. Reproductive: Status post hysterectomy. No adnexal masses. Other: No intraperitoneal free fluid. No intraperitoneal free gas. No organized fluid collection. Musculoskeletal: Tiny fat containing umbilical hernia. No suspicious lytic or blastic osseous lesions. No acute displaced fracture. Grade 1 anterolisthesis of L4 on L5. Intervertebral disc space vacuum phenomenon and endplate sclerosis at the L5-S1 level. IMPRESSION: 1. 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 2. Nonobstructive punctate right nephrolithiasis. 3. Question bilateral nephrocalcinosis. 4. Otherwise limited evaluation on this noncontrast study. 5.  Aortic Atherosclerosis (ICD10-I70.0). These results were called by telephone at the time of interpretation on 05/28/2023 at 12:44 am to provider [name] , who verbally acknowledged these results. Electronically Signed   By: Tish Frederickson M.D.   On: 05/08/2023 00:53   DG Chest Portable 1 View Result Date:  05/12/2023 CLINICAL DATA:  Recent diagnosis with COVID presenting with shortness of breath. EXAM: PORTABLE CHEST 1 VIEW COMPARISON:  Jun 14, 2014 FINDINGS: The heart size and mediastinal contours are within normal limits. Marked severity multifocal infiltrates are seen within the mid right lung and bilateral lung bases. No pleural effusion or pneumothorax is identified. The visualized skeletal structures are unremarkable. IMPRESSION: Marked severity multifocal infiltrates within the mid right lung and bilateral lung bases. Electronically Signed   By: Aram Candela M.D.   On: 05/12/2023 22:37     Assessment & Plan: Pt is a 48 y.o. female with a PMHx of asthma, anxiety/depression, opioid use, who was admitted to [facility] on 05/12/2023 for evaluation of shortness of breath, productive cough, myalgia, nausea, and vomiting and subsequently found to have COVID-19 pneumonia with multiorgan failure.  1.  Acute kidney injury secondary to acute tubular necrosis. 2.  Acute respiratory failure secondary to COVID-19 pneumonia. 3.  Acute metabolic acidosis with high lactic acid level. 4.  Hyponatremia.  Plan: Patient found to have very severe illness at the time of our evaluation.  We had a discussion with critical care earlier in the day.  Right IJ temporary dialysis catheter was placed.  We recommend initiation of CRRT which the patient was started on.  Patient was started on CRRT using 4K bath with net even fluid removal goal.  Patient with significant respiratory issues at the time of our evaluation requiring 100% FiO2.  Extremely guarded prognosis given initial presentation.

## 2023-06-05 NOTE — Death Summary Note (Signed)
 DEATH SUMMARY   Patient Details  Name: Caitlin Nicholson MRN: [id number] DOB: 12-13-1975  Admission/Discharge Information   Admit Date:  30-May-2023  Date of Death:  May 31, 2023   Time of Death:  06-Jun-1599  Length of Stay: 1  Referring Physician: System, Provider Not In   Reason(s) for Hospitalization  COVID, ARDS, SEPTIC SHOCK  Diagnoses  Preliminary cause of death: COVID, ARDS, SEPTIC SHOCK Secondary Diagnoses (including complications and co-morbidities):  Principal Problem:   Acute respiratory failure with hypoxia [facility])   Brief Hospital Course (including significant findings, care, treatment, and services provided and events leading to death)  48 y.o female  with significant PMH of Asthma, Anxiety and Depression and opiod use on suboxone who presented to the ED with chief complaints of shortness of breath, productive cough, myalgia, diarrhea, nausea and vomiting.   Patient reported that she went to urgent care on 05/11/23 and was diagnosed with COVID and started on molnupiravir.  Her symptoms however got worse today prompting her to call EMS.  On EMS arrival, patient was found with oxygen saturation in the low 80s on room air she was placed on 6 L which did not help therefore placed on nonrebreather with improvement to 85%. Patient subsequently intubated Significant Hospital Events   05/30/2023: Admit to the ICU with acute hypoxic respiratory failure in the setting of COVID-19 pneumonia, with possible ARDS 05-31-23: Patient developed worsening respiratory status, CT chest obtained which showed diffused alveolar hemorrhage.  Patient suffered brief PEA cardiac arrest PERI-Intubation due to significant hypoxia from excessive hemoptysis from alveolar hemorrhage.    GOALS OF CARE DISCUSSION   The Clinical status was relayed to family in detail- Mother Father Sister   Updated and notified of patients medical condition- Patient remains unresponsive and will not open eyes to command.   Patient with  increased WOB and using accessory muscles to breathe Explained to family course of therapy and the modalities  Patient with Progressive multiorgan failure with a very high probablity of a very minimal chance of meaningful recovery despite all aggressive and optimal medical therapy.    Patient is now DNR status     Family understands the situation. Severe LUNG DAMAGE from COVID and h/o tobacco abuse and Vaping Severe shock-multiple pressors Patient is in active dying process  Family at bedside as patient passed away  Pertinent Labs and Studies  Significant Diagnostic Studies DG Chest Port 1 View Result Date: May 31, 2023 CLINICAL DATA:  Central line placement. EXAM: PORTABLE CHEST 1 VIEW COMPARISON:  Same day. FINDINGS: Interval placement of right internal jugular catheter with distal tip in expected position of cavoatrial junction. No definite pneumothorax is noted. Otherwise stable support apparatus. Stable bilateral lung opacities as noted on prior exam. IMPRESSION: Interval placement of right internal jugular catheter with distal tip in expected position of cavoatrial junction Electronically Signed   By: Lupita Raider M.D.   On: 05/31/23 14:58   DG Chest Port 1 View Result Date: 2023/05/31 CLINICAL DATA:  Respiratory failure. EXAM: PORTABLE CHEST 1 VIEW COMPARISON:  Same day. FINDINGS: Endotracheal and nasogastric tubes are unchanged. Left internal jugular catheter is unchanged. Elevated right hemidiaphragm is noted. Bilateral lung opacities are noted concerning for edema or pneumonia. Bony thorax is unremarkable. IMPRESSION: Stable support apparatus. Bilateral lung opacities are again noted concerning for pneumonia or edema. Electronically Signed   By: Lupita Raider M.D.   On: 05-31-23 11:50   DG Chest Port 1 View Result Date: 2023-05-31 CLINICAL DATA:  Evaluate  central venous catheter placement EXAM: PORTABLE CHEST 1 VIEW COMPARISON:  05/14/2023 FINDINGS: ET tube tip is 6.9 cm above  the carina. There is an enteric tube with tip coursing below the GE junction. Left IJ central venous catheter is identified with tip in the projection of the superior cavoatrial junction. No pneumothorax identified. Unchanged cardiomediastinal contours. Persistent bilateral mid and lower lung zone airspace consolidation. This is not significantly changed in the interval. IMPRESSION: 1. Satisfactory position of left IJ central venous catheter with tip in the projection of the superior cavoatrial junction. No pneumothorax. 2. Unchanged bilateral mid and lower lung zone airspace consolidation. Electronically Signed   By: Signa Kell M.D.   On: 05/21/2023 04:59   DG Chest Port 1 View Result Date: 05/16/2023 CLINICAL DATA:  Post intubation and OG tube placement. Acute respiratory failure, code blue, covid-19 positive. EXAM: PORTABLE CHEST 1 VIEW COMPARISON:  CT chest abdomen pelvis 05/12/2023, chest x-ray 05/12/2023 FINDINGS: Endotracheal tube 8-9 cm above the carina. Enteric tube courses below the diaphragm with tip and side port overlying the expected region of the gastric lumen. Cardiac paddles overlie the chest. The heart and mediastinal contours are unchanged. Interval worsening of bilateral patchy airspace opacities most prominent along the mid to lower lung zones. No pulmonary edema. No definite pleural effusion. No pneumothorax. Gaseous distension of the visualized small and large bowel. No acute osseous abnormality. IMPRESSION: 1. Interval worsening of bilateral patchy airspace opacities most prominent along the mid to lower lung zones. 2. Endotracheal tube 8-9 cm above the carina. Recommend advancing by 3 cm. 3. Enteric tube in good position. 4.  Gaseous distension of the visualized small and large bowel. 5. 6. These results will be called to the ordering clinician or representative by the Radiologist Assistant, and communication documented in the PACS or [employer]. Electronically Signed   By:  Tish Frederickson M.D.   On: 06/01/2023 02:35   DG Abd 1 View Result Date: 05/17/2023 CLINICAL DATA:  Post intubation and OG tube placement. Acute respiratory failure, code blue, covid-19 positive. EXAM: PORTABLE CHEST 1 VIEW COMPARISON:  CT chest abdomen pelvis 05/12/2023, chest x-ray 05/12/2023 FINDINGS: Endotracheal tube 8-9 cm above the carina. Enteric tube courses below the diaphragm with tip and side port overlying the expected region of the gastric lumen. Cardiac paddles overlie the chest. The heart and mediastinal contours are unchanged. Interval worsening of bilateral patchy airspace opacities most prominent along the mid to lower lung zones. No pulmonary edema. No definite pleural effusion. No pneumothorax. Gaseous distension of the visualized small and large bowel. No acute osseous abnormality. IMPRESSION: 1. Interval worsening of bilateral patchy airspace opacities most prominent along the mid to lower lung zones. 2. Endotracheal tube 8-9 cm above the carina. Recommend advancing by 3 cm. 3. Enteric tube in good position. 4.  Gaseous distension of the visualized small and large bowel. 5. 6. These results will be called to the ordering clinician or representative by the Radiologist Assistant, and communication documented in the PACS or [employer]. Electronically Signed   By: Tish Frederickson M.D.   On: 05/21/2023 02:35   CT CHEST ABDOMEN PELVIS WO CONTRAST Result Date: 05/23/2023 CLINICAL DATA:  Sepsis. Patient coded. Copious amount of blood coming from mouth/nodes. Diagnosed with Covid yesterday, SOB today, found with O2 saturation of 82% RA. [place] at 6 EXAM: CT CHEST, ABDOMEN AND PELVIS WITHOUT CONTRAST TECHNIQUE: Multidetector CT imaging of the chest, abdomen and pelvis was performed following the standard protocol without IV contrast.  RADIATION DOSE REDUCTION: This exam was performed according to the departmental dose-optimization program which includes automated exposure control, adjustment of the  mA and/or kV according to patient size and/or use of iterative reconstruction technique. COMPARISON:  Chest x-ray 05/12/2023. chest x-ray 06/14/2014 FINDINGS: CT CHEST FINDINGS Cardiovascular: Normal heart size. No significant pericardial effusion. The thoracic aorta is normal in caliber. No atherosclerotic plaque of the thoracic aorta. No coronary artery calcifications. Mediastinum/Nodes: No gross hilar adenopathy, noting limited sensitivity for the detection of hilar adenopathy on this noncontrast study. No enlarged mediastinal or axillary lymph nodes. Thyroid gland, trachea, and esophagus demonstrate no significant findings. Lungs/Pleura: Diffuse, bilateral lower lung zone predominant, peribronchovascular patchy ground-glass and consolidative airspace opacities. No pleural effusion. No pneumothorax. Musculoskeletal: No chest wall abnormality. No suspicious lytic or blastic osseous lesions. No acute displaced fracture. CT ABDOMEN PELVIS FINDINGS Hepatobiliary: No focal liver abnormality. No gallstones, gallbladder wall thickening, or pericholecystic fluid. No biliary dilatation. Pancreas: Diffusely atrophic. No focal lesion. Otherwise normal pancreatic contour. No surrounding inflammatory changes. No main pancreatic ductal dilatation. Spleen: Normal in size without focal abnormality. Adrenals/Urinary Tract: No adrenal nodule bilaterally. Question nephrocalcinosis. Punctate right nephrolithiasis. No hydronephrosis. No definite contour-deforming renal mass. No ureterolithiasis or hydroureter. The urinary bladder is unremarkable. Stomach/Bowel: Stomach is within normal limits. No evidence of bowel wall thickening or dilatation. The appendix is not definitely identified with no inflammatory changes in the right lower quadrant to suggest acute appendicitis. Vascular/Lymphatic: No abdominal aorta or iliac aneurysm. Mild atherosclerotic plaque of the aorta and its branches. No abdominal, pelvic, or inguinal  lymphadenopathy. Reproductive: Status post hysterectomy. No adnexal masses. Other: No intraperitoneal free fluid. No intraperitoneal free gas. No organized fluid collection. Musculoskeletal: Tiny fat containing umbilical hernia. No suspicious lytic or blastic osseous lesions. No acute displaced fracture. Grade 1 anterolisthesis of L4 on L5. Intervertebral disc space vacuum phenomenon and endplate sclerosis at the L5-S1 level. IMPRESSION: 1. 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 2. Nonobstructive punctate right nephrolithiasis. 3. Question bilateral nephrocalcinosis. 4. Otherwise limited evaluation on this noncontrast study. 5.  Aortic Atherosclerosis (ICD10-I70.0). These results were called by telephone at the time of interpretation on 05/30/2023 at 12:44 am to provider [name] , who verbally acknowledged these results. Electronically Signed   By: Tish Frederickson M.D.   On: 05/16/2023 00:53   DG Chest Portable 1 View Result Date: 05/12/2023 CLINICAL DATA:  Recent diagnosis with COVID presenting with shortness of breath. EXAM: PORTABLE CHEST 1 VIEW COMPARISON:  Jun 14, 2014 FINDINGS: The heart size and mediastinal contours are within normal limits. Marked severity multifocal infiltrates are seen within the mid right lung and bilateral lung bases. No pleural effusion or pneumothorax is identified. The visualized skeletal structures are unremarkable. IMPRESSION: Marked severity multifocal infiltrates within the mid right lung and bilateral lung bases. Electronically Signed   By: Aram Candela M.D.   On: 05/12/2023 22:37    Microbiology Recent Results (from the past 240 hours)  Resp Panel by RT-PCR (Flu A&B, Covid) Anterior Nasal Swab     Status: Abnormal   Collection Time: 05/11/23 10:01 AM   Specimen: Anterior Nasal Swab  Result Value Ref Range Status   SARS Coronavirus 2 by RT PCR POSITIVE (A) NEGATIVE Final    Comment: (NOTE) SARS-CoV-2  target nucleic acids are DETECTED.  The SARS-CoV-2 RNA is generally detectable in upper respiratory specimens during the acute phase of infection. Positive results are indicative of the presence of the  identified virus, but do not rule out bacterial infection or co-infection with other pathogens not detected by the test. Clinical correlation with patient history and other diagnostic information is necessary to determine patient infection status. The expected result is Negative.  Fact Sheet for Patients: BloggerCourse.com  Fact Sheet for Healthcare Providers: SeriousBroker.it  This test is not yet approved or cleared by the [place] FDA and  has been authorized for detection and/or diagnosis of SARS-CoV-2 by FDA under an Emergency Use Authorization (EUA).  This EUA will remain in effect (meaning this test can be used) for the duration of  the COVID-19 declaration under Section 564(b)(1) of the A ct, 21 U.S.C. section 360bbb-3(b)(1), unless the authorization is terminated or revoked sooner.     Influenza A by PCR NEGATIVE NEGATIVE Final   Influenza B by PCR NEGATIVE NEGATIVE Final    Comment: (NOTE) The Xpert Xpress SARS-CoV-2/FLU/RSV plus assay is intended as an aid in the diagnosis of influenza from Nasopharyngeal swab specimens and should not be used as a sole basis for treatment. Nasal washings and aspirates are unacceptable for Xpert Xpress SARS-CoV-2/FLU/RSV testing.  Fact Sheet for Patients: BloggerCourse.com  Fact Sheet for Healthcare Providers: SeriousBroker.it  This test is not yet approved or cleared by the [place] FDA and has been authorized for detection and/or diagnosis of SARS-CoV-2 by FDA under an Emergency Use Authorization (EUA). This EUA will remain in effect (meaning this test can be used) for the duration of the COVID-19 declaration under Section  564(b)(1) of the Act, 21 U.S.C. section 360bbb-3(b)(1), unless the authorization is terminated or revoked.  Performed at [facility], [address], [place], [place] [address]   Blood culture (routine x 2)     Status: None (Preliminary result)   Collection Time: 05/11/2023 12:10 AM   Specimen: BLOOD  Result Value Ref Range Status   Specimen Description BLOOD BLOOD RIGHT ARM  Final   Special Requests   Final    BOTTLES DRAWN AEROBIC AND ANAEROBIC Blood Culture results may not be optimal due to an inadequate volume of blood received in culture bottles   Culture   Final    NO GROWTH < 12 HOURS Performed at [facility], [address] [place], [place] [address]    Report Status PENDING  Incomplete  Blood culture (routine x 2)     Status: None (Preliminary result)   Collection Time: 05/17/2023 12:10 AM   Specimen: BLOOD  Result Value Ref Range Status   Specimen Description BLOOD BLOOD RIGHT HAND  Final   Special Requests   Final    BOTTLES DRAWN AEROBIC AND ANAEROBIC Blood Culture results may not be optimal due to an inadequate volume of blood received in culture bottles   Culture   Final    NO GROWTH < 12 HOURS Performed at [facility], [address] [place], [place] [address]    Report Status PENDING  Incomplete  MRSA Next Gen by PCR, Nasal     Status: Abnormal   Collection Time: 05/21/2023  3:20 AM   Specimen: Nasal Mucosa; Nasal Swab  Result Value Ref Range Status   MRSA by PCR Next Gen DETECTED (A) NOT DETECTED Final    Comment: RESULT CALLED TO, READ BACK BY AND VERIFIED WITH:  Carollee Herter DOUGHERTY AT 0518 05/06/2023 JG (NOTE) The GeneXpert MRSA Assay (FDA approved for NASAL specimens only), is one component of a comprehensive MRSA colonization surveillance program. It is not intended to diagnose MRSA  infection nor to guide or monitor treatment for MRSA infections. Test performance is not FDA approved in patients less than 5  years old. Performed at [facility], [address] [place], [place] [address]     Lab Basic Metabolic Panel: Recent Labs  Lab 05/12/23 2108 05/10/2023 0237 06/02/2023 0252 05/19/2023 0943  NA 132* 137  --  134*  K 2.4* 2.7*  --  4.0  CL 95* 100  --  98  CO2 16* 19*  --  18*  GLUCOSE 286* 201*  --  110*  BUN 23* 24*  --  30*  CREATININE 2.25* 1.81*  --  2.37*  CALCIUM 8.0* 6.5*  --  6.0*  MG  --   --  1.8  --    Liver Function Tests: Recent Labs  Lab 05/12/23 2108 05/23/2023 0237 05/29/2023 0943  AST 73* 130* 173*  ALT 48* 66* 53*  ALKPHOS 52 28* 34*  BILITOT 1.5* 0.7 1.0  PROT 6.3* 3.6* 3.2*  ALBUMIN 2.8* 1.6* <1.5*   No results for input(s): "LIPASE", "AMYLASE" in the last 168 hours. No results for input(s): "AMMONIA" in the last 168 hours. CBC: Recent Labs  Lab 05/12/23 2011 05/23/2023 0254  WBC 0.9* 0.9*  NEUTROABS 0.4*  --   HGB 17.6* 13.9  HCT 52.6* 41.6  MCV 83.5 83.2  PLT 164 85*   Cardiac Enzymes: No results for input(s): "CKTOTAL", "CKMB", "CKMBINDEX", "TROPONINI" in the last 168 hours. Sepsis Labs: Recent Labs  Lab 05/12/23 2011 05/12/23 2108 05/12/23 2224 06/02/2023 0010 05/27/2023 0254 05/09/2023 0546 05/11/2023 1049  PROCALCITON  --  >150.00  --   --   --   --   --   WBC 0.9*  --   --   --  0.9*  --   --   LATICACIDVEN  --   --    < > 7.8* >9.0* 6.0* >9.0*   < > = values in this interval not displayed.     Nickalus Thornsberry 05/31/2023, 4:02 PM

## 2023-06-05 NOTE — ED Notes (Signed)
 ED RN received call from ICU RN who would be taking over care for this pt. Report was given to ICU RN. After report, no further questions required for myself. RT at bedside at this time in preparation to take pt to CT and then to ICU floor. Pt aware. Pt family aware.

## 2023-06-05 NOTE — Progress Notes (Signed)
 CRRT started

## 2023-06-05 NOTE — Procedures (Signed)
 Central Venous Catheter Insertion Procedure Note  Caitlin Nicholson  [id number]  1975/08/05  Date:05/20/2023  Time:12:16 PM   Provider Performing:Yadir Zentner Earnest Conroy   Procedure: Insertion of Non-tunneled Central Venous Catheter(36556)with US guidance (72536)    Indication(s) Hemodialysis  Consent Risks of the procedure as well as the alternatives and risks of each were explained to the patient and/or caregiver.  Consent for the procedure was obtained and is signed in the bedside chart  Anesthesia Topical only with 1% lidocaine   Timeout Verified patient identification, verified procedure, site/side was marked, verified correct patient position, special equipment/implants available, medications/allergies/relevant history reviewed, required imaging and test results available.  Sterile Technique Maximal sterile technique including full sterile barrier drape, hand hygiene, sterile gown, sterile gloves, mask, hair covering, sterile ultrasound probe cover (if used).  Procedure Description Area of catheter insertion was cleaned with chlorhexidine and draped in sterile fashion.   With real-time ultrasound guidance a HD catheter was placed into the right internal jugular vein.  Nonpulsatile blood flow and easy flushing noted in all ports.  The catheter was sutured in place and sterile dressing applied.  Complications/Tolerance None; patient tolerated the procedure well. Chest X-ray is ordered to verify placement for internal jugular or subclavian cannulation.  Chest x-ray is not ordered for femoral cannulation.  EBL Minimal  Specimen(s) None  Zada Girt, AGNP  Pulmonary/Critical Care Pager [phone] (please enter 7 digits) PCCM Consult Pager [phone] (please enter 7 digits)

## 2023-06-05 NOTE — Progress Notes (Signed)
 Dr. Belia Heman notified patients BP dropping, neo maxed. Orders to increase levophed. No additional orders at this time. Continue to assess.

## 2023-06-05 NOTE — Progress Notes (Signed)
 Spoke with Dr. Belia Heman, orders to stop versed and fentanyl. Patient is currently unresponsive. Continue to assess.

## 2023-06-05 NOTE — Progress Notes (Signed)
 Contacted [facility] transfer center for possible consideration of ECMO. Per transfer center, [facility] is currently at capacity and it is likely that they would not accept patient given the hemodynamic instability on multiple pressors and high oxygen requirement. Transfer declined at this time.   [name], DNP, CCRN, FNP-C, AGACNP-BC Acute Care & Family Nurse Practitioner  [facility]  See Amion for personal pager PCCM on call pager [phone] until 7 am

## 2023-06-05 NOTE — Progress Notes (Signed)
   05/08/2023 1430  Spiritual Encounters  Type of Visit Initial  Care provided to: [facility] partners present during encounter Nurse  Reason for visit Patient death  OnCall Visit No   Chaplain visited patient because On Call colleague shared patient was about to transition and did.  Chaplain visited family as patient was transitioning and offered prayer and support to extended family, parents and daughters.  Chaplain shared with On-Call Chaplain to let the evening PRN Chaplain know about this family and check on them later on this evening.    Rev. Rana M. Lanice Schwab Resident [facility]

## 2023-06-05 NOTE — Significant Event (Signed)
 PEA arrest:   0029: No pulse; start CPR; Epi #1 0031: Pulse check - + ROSC - ST 0032: ETT w/ positive color change 0035: Sodium Bicarb x2 amps 0036: No pulse; restart CPR; Epi #2 0038: No pulse; restart CPR 0039: Epi #3; ETT repositioned w/ positive color change 0041: Pulse check - + ROSC - ST 0042 - Sodium Bicarb x2 amps

## 2023-06-05 NOTE — Procedures (Signed)
 CENTRAL VENOUS CATHETER INSERTION PROCEDURE NOTE  Caitlin Nicholson  [id number]  12-Jan-1976  Date:05/22/2023  Time:5:22 AM   Provider Performing:Haylo Fake A Ebon Ketchum   Procedure: Insertion of Non-tunneled Central Venous Catheter(36556) with US guidance (51884)   Indication(s) Medication administration and Difficult access  Consent Unable to obtain consent due to emergent nature of procedure.  Anesthesia Topical only with 1% lidocaine   Timeout Verified patient identification, verified procedure, site/side was marked, verified correct patient position, special equipment/implants available, medications/allergies/relevant history reviewed, required imaging and test results available.  Sterile Technique Maximal sterile technique including full sterile barrier drape, hand hygiene, sterile gown, sterile gloves, mask, hair covering, sterile ultrasound probe cover (if used).  Procedure Description Area of catheter insertion was cleaned with chlorhexidine and draped in sterile fashion.  With real-time ultrasound guidance a central venous catheter was placed into the left internal jugular vein. Nonpulsatile blood flow and easy flushing noted in all ports.  The catheter was sutured in place and sterile dressing applied.  Complications/Tolerance None; patient tolerated the procedure well. Chest X-ray is ordered to verify placement for internal jugular or subclavian cannulation.   Chest x-ray is not ordered for femoral cannulation.  EBL Minimal  Specimen(s) None  [name], DNP, CCRN, FNP-C, AGACNP-BC Acute Care & Family Nurse Practitioner  [facility]  See Amion for personal pager PCCM on call pager [phone] until 7 am

## 2023-06-05 NOTE — Progress Notes (Signed)
   05/09/2023 1600  Spiritual Encounters  Type of Visit Follow up  Care provided to: [facility] partners present during encounter Nurse  Referral source Nurse (RN/NT/LPN)  Reason for visit Patient death  OnCall Visit Yes   Chaplain spent time with family after the death of patient providing compassionate presence, grief support and empathy.  Chaplain team following as appropriate.  Chaplain spiritual support services remain available as the need arises.

## 2023-06-05 NOTE — Progress Notes (Signed)
 Per Dr. Belia Heman increased levophed to 100 mcg/min. He way change drip titrations orders for pressors. Continue to assess.

## 2023-06-05 NOTE — IPAL (Signed)
  Interdisciplinary Goals of Care Family Meeting   Date carried out: 05/15/2023  Location of the meeting: Conference room  Member's involved: Physician and Family Member or next of kin    GOALS OF CARE DISCUSSION  The Clinical status was relayed to family in detail- Mother Father Sister  Updated and notified of patients medical condition- Patient remains unresponsive and will not open eyes to command.   Patient with increased WOB and using accessory muscles to breathe Explained to family course of therapy and the modalities  Patient with Progressive multiorgan failure with a very high probablity of a very minimal chance of meaningful recovery despite all aggressive and optimal medical therapy.   PATIENT REMAINS FULL CODE  Family understands the situation. Severe LUNG DAMAGE from COVID and h/o tobacco abuse and Vaping  Family are satisfied with Plan of action and management. All questions answered  Additional CC time 35 mins   Wandra Babin Santiago Glad, M.D.  [facility]  Medical Director [facility] Medical Director [facility] Cardio-Pulmonary Department

## 2023-06-05 NOTE — Progress Notes (Signed)
 Bicarb given to patient. Blood pressure increased. Per MD NEO titrated down to 350 MCG/MIN. Continue to assess.

## 2023-06-05 NOTE — Progress Notes (Signed)
 PHARMACY CONSULT NOTE - FOLLOW UP  Pharmacy Consult for Electrolyte Monitoring and Replacement   Recent Labs: Potassium (mmol/L)  Date Value  05/08/2023 2.7 (LL)  10/18/2012 3.7   Calcium (mg/dL)  Date Value  21/30/8657 6.5 (L)   Calcium, Total (mg/dL)  Date Value  84/69/6295 8.9   Albumin (g/dL)  Date Value  28/41/3244 1.6 (L)  10/16/2012 4.4   Sodium (mmol/L)  Date Value  05/17/2023 137  10/16/2012 135 (L)     Assessment: 4/7 @ 2108 = 2.4  Goal of Therapy:  Electrolytes WNL   Plan:  KCl 10 mEq IV X 4 ordered to start on 4/7 @ ~ 2330. - will replete conservatively b/c of decreased renal function - will recheck electrolytes on 4/8 @ 0500   4/8:  K @ 0237 = 2.7 (this is after KCl 10 mEq IV X 3)  - will order additional KCl 10 mEq IV X 4 to start on 4/8 @ ~ 0430. - Will recheck electrolytes @ 1000.   Scherrie Gerlach ,PharmD Clinical Pharmacist 05/07/2023 4:09 AM

## 2023-06-05 NOTE — Progress Notes (Signed)
 Patient pronounced at 16:01 by Dyke Brackett and Dewitt Rota. Patient EKG asystole, when auscultated no heart tones or breath sounds, no pulse when palpated. Patient mottled. Patients family is at bedside. Dr. Belia Heman, supervisor and CDS notified. Patient is not a candidate for donation per Tim. Chaplin in room with family. She will notify me once they have a funeral picked. Continue to assess.

## 2023-06-05 NOTE — Progress Notes (Signed)
 During rounds per Dr. Belia Heman increase NEO to max of 400 mcg/min. Continue to assess. CVP 10-11.

## 2023-06-05 NOTE — IPAL (Signed)
 Interdisciplinary Goals of Care Family Meeting     Date carried out: 05/08/2023 Location of the meeting: Bedside   Member's involved: NP and Family Member or next of kin   Durable Power of Attorney or Environmental health practitioner: Patient's children, mother, sister    Discussion:  Advance Care Planning/Goals of Care discussion was performed during the course of treatment to decide on type of care right for this patient following admission to the ICU.   I met several times with patient's family consisting of her two daughters ages 57 and 44 years, patient's mother, sister, father and brother to discuss goals of care in details following  change in patient's current status. Reviewed patient's worsening lab, ABGs, vital signs including unstable HR and blood pressure requiring multiple pressors  and overall poor prognosis with the family at the bedside and answered all their question.   Discussed prognosis, expected outcome with or without ongoing aggressive treatments and the options for de-escalation of care.   Diagnosis(es): Acute hypoxic hypercapnic respiratory failure secondary COVID pneumonia with diffuse alveolar hemorrhage s/p PEA cardiac Arrest Prognosis: Poor Code Status: FULL Disposition: ICU Next Steps:  Family understands the situation. They would wish to to continue with aggressive treatment including CPR/ACLS and defibrillation if patient's condition deteriorates to cardiac arrest    Family are satisfied with Plan of action and management. All questions answered     Total Time Spent Face to Face addressing advance care planning in the presence of the Patient: 35 minutes       [name], DNP, FNP-C, AGACNP-BC Acute Care Nurse Practitioner [facility] Pager: [phone] [place] [facility]

## 2023-06-05 NOTE — IPAL (Signed)
  Interdisciplinary Goals of Care Family Meeting   Date carried out: 05/18/2023  Location of the meeting: Conference room  Member's involved: Physician and Family Member or next of kin    GOALS OF CARE DISCUSSION  The Clinical status was relayed to family in detail- Mother Father Sister  Updated and notified of patients medical condition- Patient remains unresponsive and will not open eyes to command.   Patient with increased WOB and using accessory muscles to breathe Explained to family course of therapy and the modalities  Patient with Progressive multiorgan failure with a very high probablity of a very minimal chance of meaningful recovery despite all aggressive and optimal medical therapy.   Patient is now DNR status   Family understands the situation. Severe LUNG DAMAGE from COVID and h/o tobacco abuse and Vaping Severe shock-multiple pressors Patient is in active dying process Plan to start CRRT Continue aggressive medical care  Family are satisfied with Plan of action and management. All questions answered  Additional CC time 35 mins   [name] Santiago Glad, M.D.  [facility]  Medical Director [facility] Medical Director [facility] Cardio-Pulmonary Department

## 2023-06-05 NOTE — ED Notes (Addendum)
 RN attempting to obtain larger PIV access for pt who still looks unwell. Pt HR improving with fluid bolus, however, pt O2 saturation continues to range from 83% to 99% on Bipap. Pt hands noted to be cold. RN does not see good O2 pleth on monitor.

## 2023-06-05 NOTE — H&P (Incomplete)
 NAME:  Caitlin Nicholson, MRN:  [id number], DOB:  [birth year], LOS: 0 ADMISSION DATE:  05/12/2023, CONSULTATION DATE:  05/12/23 REFERRING MD: Fuller Plan, CHIEF COMPLAINT:  Shortness of breath    HPI  48 y.o female  with significant PMH of Asthma, Anxiety and Depression and opiod use on suboxone who presented to the ED with chief complaints of shortness of breath, productive cough, myalgia, diarrhea, nausea and vomiting.  Patient reported that she went to urgent care on 05/11/23 and was diagnosed with COVID and started on molnupiravir.  Her symptoms however got worse today prompting her to call EMS.  On EMS arrival, patient was found with oxygen saturation in the low 80s on room air she was placed on 6 L which did not help therefore placed on nonrebreather with improvement to 85%.   ED Course: Initial vital signs showed HR of 113 beats/minute, BP 90/62 mm Hg, the RR 28 breaths/minute, and the oxygen saturation 98% on 100% BiPAP and a temperature of 98.47F (36.9C).  Pertinent Labs/Diagnostics Findings: Na+/ K+: 132/2.4.  Glucose: 286 BUN/Cr.:  23/2.25  AST/ALT: 73/48, CO2 16, anion gap 21 WBC:0.6  PCT: >150 Lactic acid: 8.9 COVID PCR: Positive  troponin: 30 BNP: 143.7 ABG: pO2 65; pCO2 71; pH 7.01;  HCO3 17.9, %O2 Sat 87.9 CXR> CTA Chest> CT Abd/pelvis> results below Patient given 30 cc/kg of fluids and started on broad-spectrum antibiotics cefepime and Azithromycin for sepsis with septic shock secondary to suspected COVID-pneumonia.  Patient remained hypotensive despite IVF boluses therefore was started on Levophed. PCCM consulted.  Past Medical History  Asthma,  anxiety and depression Opioid use  Significant Hospital Events   4/7: Admit to the ICU with acute hypoxic respiratory failure in the setting of COVID-19 pneumonia, with possible ARDS 4/8: Patient developed worsening respiratory status, CT chest obtained which showed diffused alveolar hemorrhage.  Patient suffered brief PEA cardiac  arrest PERI-Intubation due to significant hypoxia from excessive hemoptysis from alveolar hemorrhage.   Consults:  Nephrology  Procedures:  4/8: The patient 4/8: Left IJ central line 4/8: Right femoral art line  Significant Diagnostic Tests:  4/7:Chest Xray> IMPRESSION: Marked severity multifocal infiltrates within the mid right lung and bilateral lung bases. 4/8: CTA Chest, abdomen and pelvis> IMPRESSION: 1. 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 2. Nonobstructive punctate right nephrolithiasis. 3. Question bilateral nephrocalcinosis. 4. Otherwise limited evaluation on this noncontrast study. 5.  Aortic Atherosclerosis (ICD10-I70.0).  Interim History / Subjective:      Micro Data:  4/7: SARS-CoV-2 PCR> POSITIVE 4/7: Influenza PCR> negative 4/7: Blood culture x2> 4/7: MRSA PCR>>  4/7: Strep pneumo urinary antigen> 4/7: Legionella urinary antigen>  Antimicrobials:  Vancomycin 4/8> Cefepime 4/7> Azithromycin 4/7>  OBJECTIVE  Blood pressure (!) 86/63, pulse (!) 114, temperature (!) 97.5 F (36.4 C), temperature source Oral, resp. rate (!) 24, SpO2 97%.    FiO2 (%):  [60 %] 60 % Pressure Support:  [5 cmH20] 5 cmH20  No intake or output data in the 24 hours ending 05/12/23 2201 There were no vitals filed for this visit.  Physical Examination  GENERAL:49 year-old critically ill patient lying in the bed intubated and sedated EYES: PEERLA. No scleral icterus. Extraocular muscles intact.  HEENT: Head atraumatic, normocephalic. Oropharynx and nasopharynx clear.  NECK:  No JVD, supple  LUNGS: Decreased breath sounds bilaterally. Diffused crackles bilaterally.   CARDIOVASCULAR: S1, S2 normal. No murmurs, rubs, or gallops.  ABDOMEN: Soft, NTND EXTREMITIES: No swelling or erythema.  Capillary refill < 3 seconds in all extremities. Pulses palpable distally. NEUROLOGIC: The patient is intubated and sedated . No focal  neurological deficit appreciated. Cranial nerves are intact.  SKIN: No obvious rash, lesion, or ulcer. Warm to touch Labs/imaging that I havepersonally reviewed  (right click and "Reselect all SmartList Selections" daily)     Labs   CBC: Recent Labs  Lab 05/12/23 2011  WBC 0.9*  NEUTROABS 0.4*  HGB 17.6*  HCT 52.6*  MCV 83.5  PLT 164    Basic Metabolic Panel: No results for input(s): "NA", "K", "CL", "CO2", "GLUCOSE", "BUN", "CREATININE", "CALCIUM", "MG", "PHOS" in the last 168 hours. GFR: CrCl cannot be calculated (Patient's most recent lab result is older than the maximum 21 days allowed.). Recent Labs  Lab 05/12/23 2011  WBC 0.9*    Liver Function Tests: No results for input(s): "AST", "ALT", "ALKPHOS", "BILITOT", "PROT", "ALBUMIN" in the last 168 hours. No results for input(s): "LIPASE", "AMYLASE" in the last 168 hours. No results for input(s): "AMMONIA" in the last 168 hours.  ABG No results found for: "PHART", "PCO2ART", "PO2ART", "HCO3", "TCO2", "ACIDBASEDEF", "O2SAT"   Coagulation Profile: No results for input(s): "INR", "PROTIME" in the last 168 hours.  Cardiac Enzymes: No results for input(s): "CKTOTAL", "CKMB", "CKMBINDEX", "TROPONINI" in the last 168 hours.  HbA1C: No results found for: "HGBA1C"  CBG: No results for input(s): "GLUCAP" in the last 168 hours.  Review of Systems:   Unable to be obtained secondary to the patient's intubated and sedated status.   Past Medical History  She,  has a past medical history of Asthma and Hypertension.   Surgical History   No past surgical history on file.   Social History   reports that she has quit smoking. She does not have any smokeless tobacco history on file. She reports current alcohol use. She reports that she does not currently use drugs.   Family History   Her family history is not on file.   Allergies Allergies  Allergen Reactions   Sulfa Antibiotics Nausea And Vomiting     Home  Medications  Prior to Admission medications   Medication Sig Start Date End Date Taking? Authorizing Provider  albuterol (PROVENTIL HFA;VENTOLIN HFA) 108 (90 BASE) MCG/ACT inhaler Inhale 2 puffs into the lungs every 4 (four) hours as needed for wheezing or shortness of breath (using q every hour).    [provider]  albuterol (PROVENTIL) (2.5 MG/3ML) 0.083% nebulizer solution Take 2.5 mg by nebulization every 6 (six) hours as needed for wheezing or shortness of breath.    [provider]  brompheniramine-pseudoephedrine-DM 30-2-10 MG/5ML syrup Take 5 mLs by mouth 4 (four) times daily as needed. 06/14/14   Joni Reining, PA-C  buprenorphine-naloxone (SUBOXONE) 8-2 mg SUBL SL tablet BUPRENORPHINE HCL-NALOXONE HCL 8-2 MG SUBL 03/03/23   [provider]  molnupiravir EUA (LAGEVRIO) 200 mg CAPS capsule Take 4 capsules (800 mg total) by mouth 2 (two) times daily for 5 days. 05/11/23 05/16/23  Radford Pax, NP  montelukast (SINGULAIR) 10 MG tablet Take 10 mg by mouth daily.    [provider]  predniSONE (STERAPRED UNI-PAK 21 TAB) 10 MG (21) TBPK tablet Take 1 tablet (10 mg total) by mouth daily. Taper over 6 days. 06/14/14   Joni Reining, PA-C  rizatriptan (MAXALT-MLT) 10 MG disintegrating tablet Take by mouth.    [provider]  traZODone (DESYREL) 100 MG tablet Take 100 mg by mouth at bedtime.    [provider]  venlafaxine XR (EFFEXOR-XR) 150 MG 24 hr capsule Take 150 mg by mouth daily with breakfast.    [provider]  verapamil (CALAN-SR) 180 MG CR tablet Take 360 mg by mouth at bedtime.    [provider]  Scheduled Meds:  budesonide (PULMICORT) nebulizer solution  0.5 mg Nebulization BID   Chlorhexidine Gluconate Cloth  6 each Topical Daily   docusate  100 mg Per Tube BID   fentaNYL (SUBLIMAZE) injection  50 mcg Intravenous Once   hydrocortisone sod succinate (SOLU-CORTEF) inj  50 mg Intravenous Q8H   insulin aspart   0-15 Units Subcutaneous Q4H   ipratropium-albuterol  3 mL Nebulization Q4H   mouth rinse  15 mL Mouth Rinse 4 times per day   polyethylene glycol  17 g Per Tube Daily   rocuronium       sodium chloride flush  10-40 mL Intracatheter Q12H   tranexamic acid  500 mg Nebulization Q8H   Continuous Infusions:  azithromycin Stopped (05/12/23 2333)   ceFEPime (MAXIPIME) IV     dexmedetomidine (PRECEDEX) IV infusion     epinephrine 0.5 mcg/min (05/30/2023 0202)   famotidine (PEPCID) IV 20 mg (05/25/2023 0427)   fentaNYL infusion INTRAVENOUS 50 mcg/hr (05/27/2023 0118)   lactated ringers 125 mL/hr at 05/12/23 2350   magnesium sulfate bolus IVPB     methylPREDNISolone (SOLU-MEDROL) injection     norepinephrine (LEVOPHED) Adult infusion 10 mcg/min (05/07/2023 0415)   potassium chloride     potassium chloride     propofol (DIPRIVAN) infusion 5 mcg/kg/min (05/10/2023 0116)   sodium bicarbonate 150 mEq in sterile water 1,150 mL infusion 150 mL/hr at 05/20/2023 0409   vancomycin     [START ON 05/15/2023] vancomycin     vasopressin 0.04 Units/min (05/26/2023 0417)   PRN Meds:.docusate sodium, fentaNYL, midazolam, morphine injection, mouth rinse, polyethylene glycol, rocuronium, sodium chloride flush, vecuronium  Active Hospital Problem list   See systems below  Assessment & Plan:  #Acute Hypoxic Respiratory Failure #Severe COVID pneumonia #Diffuse Alveolar Hemorrhage #ARDS -con't full mechanical support 6-8cc/kg/Vt  -titrate FiO2, PEEP to maintain O2 sat >90%  -Lung protective ventilation  -PRN Chest X-ray & ABG -PRN and scheduled bronchodilators -Start systemic steroid -SAT/SBT when appropriate  -prn fentanyl, prop for RASS -1   #Diffuse Alveolar Hemorrhage in the setting of Severe COVID Infection -check vasculitis panel -monitor cbc, fever curve -IV Corticosteroids -s/p nebulized TXA  -Hold Anticoagulation  for now -Keep Npo -Bronchoscopy to obtain BAL and localize source of hemorrhage if  indicated  #Brief PEA Cardiac Arrest <3 minutes with ROSC Likely precipitated by Respiratory arrest in the setting of severe Hypoxia and massive Hemoptysis -TTM protocol, goal nomothermia -Lactic acid >9 with high pressor requirement.  -Obtain Echo  -Continue NE and Epi for MAP goals -d/w Dr. Shirlee Latch about need for ECMO however patient per his recommendations, patient is not a good candidate for ECHO due to severe infection with shock on multiple pressors and mult organ failure ECMO may precipitate circulatory collapse. He however recommends proning if stable   #Severe Sepsis with Shock due to COVID Pneumonia Initial interventions/workup included: 2 L of NS/LR & Cefepime/Azithromycin -F/u cultures, trend lactic/ PCT -Monitor WBC/ fever curve -Obtain MRSA nasal swab, RVP, legionella, strep urine Ag -IV antibiotics Vancomycin, Cefepime AND Azithromycin -Gentle IVF hydration as needed -Pressors PRN for MAP goal >65 -Strict I/O's    #AKI likely ATN in the setting of above #Hyponatremia #Hypokalemia #AGMA with Lactic Acidosis -Trend Lactate -  Monitor I&O's / urinary output -Follow BMP -Ensure adequate renal perfusion -Avoid nephrotoxic agents as able -Replace electrolytes as indicated -Nephro consult may need CRRT   #Transaminitis Mildly elevated LFTs. Likely shock/ischemic vs congestive  -Acetaminophen level and tox screen -Hold hepatotoxic -Daily LFTs  Best practice:  Diet:  NPO Pain/Anxiety/Delirium protocol (if indicated): Yes (RASS goal -1) VAP protocol (if indicated): Yes DVT prophylaxis: Contraindicated GI prophylaxis: H2B Glucose control:  SSI Yes Central venous access:  Yes, and it is still needed Arterial line:  Yes, and it is still needed Foley:  Yes, and it is still needed Mobility:  bed rest  PT consulted: N/A Last date of multidisciplinary goals of care discussion [updated family at bedside multiple times] Code Status:  full code Disposition: ICU   =  Goals of Care = Code Status Order: FULL  Primary Emergency ContactEmelia Salisbury, Home Phone: [phone] Family Wishes to pursue full aggressive treatment and intervention options, including CPR/ACLS Defibrillation    Critical care time: 45 minutes        [name] DNP, CCRN, FNP-C, AGACNP-BC Acute Care & Family Nurse Practitioner [facility] PCCM on call pager [phone]

## 2023-06-05 NOTE — Progress Notes (Addendum)
 eLink Physician-Brief Progress Note Patient Name: Caitlin Nicholson DOB: [birth year] MRN: [id number]   Date of Service  05/15/2023  HPI/Events of Note  48 year old female with a history of asthma presents with dyspnea and was recently diagnosed with COVID.  Unfortunately she had a cardiac arrest and was intubated.  She is persistently hypoxic at this time.  Vitals consistent with tachycardia, hypertension, and persistent hypoxemia.  Currently on norepinephrine infusion.  Results consistent with hypokalemia, anion gap metabolic acidosis, elevated creatinine.  Mild troponin elevation.  Severe lactic acidosis.  Neutropenia present with polycythemia.  CT chest with diffuse airspace disease concerning for pulmonary hemorrhage  eICU Interventions  Ground team at bedside  Maintain scheduled SVNs, TXA, appears to have a small air leak-team aware  Added coagulopathy workup, ANA, peripheral smear  Norepinephrine as needed to maintain MAP greater than 65  Deep sedation and intermittent paralysis.  Consider prone positioning,  DVT prophylaxis currently with heparin.  Consider discontinuation in the setting of pulmonary hemorrhage GI prophylaxis with famotidine   0151 -our gastric tube is subdiaphragmatic, okay to use.  Advance endotracheal tube 3 cm.  0213 -reviewed the case with bedside team.  Aggressively address acidemia, escalate vasopressors, and the patient is already on appropriate therapy.  May benefit from prone ventilation once hemodynamics have stabilized  0613 -agree with deep sedation and paralysis.  Improving vasopressor requirements.  Anticipate need for renal replacement therapy this morning.  Add vitamin K x 1.  Intervention Category Evaluation Type: New Patient Evaluation  Gurshaan Matsuoka 05/15/2023, 12:57 AM

## 2023-06-05 NOTE — Progress Notes (Signed)
 Pt arrived to ICU via hospital stretcher with ED nurse and RT on BiPap. Pt noted to be sitting up with increased WOB and tripodding. Pt alert and oriented. Pt transferred to ICU bed. Unable to obtain oxygen saturation levels at time of arrival. Provider notified. Order received for Morphine 2mg  and Precedex. Once CT chest was seen by provider, decision to intubate was made. RT aware and at bedside to assist with intubation. During intubation, there was a significant amount of blood noted in pt's esophagus, making intubation difficult. ED provider at bedside to assist with intubation. Pt suffered a PEA arrest s/p intubation (see note). A-line, TL CVC, and foley placed this shift.   Of note, all titrations for pressors were verbal orders from providers to ensure that pt maintained a blood pressure during emergent situations.   Pt family members in to visit family and updated by provider.

## 2023-06-05 NOTE — ED Notes (Signed)
 Pt mildly tolerating BiPap machine. Pt RR are starting to improve but pt is still tachycardic. MD aware.

## 2023-06-05 NOTE — Progress Notes (Signed)
 Chaplain relieves Chaplain Denisa at bedside, joining family pastor Rev. Trudie Buckler. Chaplain ensures family is escorted to and from room after nurse, RT, and NT remove tubes and machinery and clean pt and room. Chaplain provides compassionate presence, grief support, and education about next steps.

## 2023-06-05 NOTE — Progress Notes (Signed)
 Providers notified during shift change that pt's right pupil is larger than left and not reactive. Left pupil is brisk and reactive. No new orders received at this time.

## 2023-06-05 NOTE — Progress Notes (Signed)
 Pharmacy Antibiotic Note  Caitlin Nicholson is a 48 y.o. female admitted on 05/12/2023 with sepsis.  Pharmacy has been consulted for Cefepime, Vancomycin dosing.  Plan: Cefepime 2 gm IV X 1 given on 4/7 @ 2131. Cefepime 2 gm IV Q12H ordered to start on 4/8 @ 0930.  Vancomycin 1750 mg IV X 1 ordered for 4/8 @ ~ 0400. Vancomycin 1750 mg IV Q48H ordered to start on 4/10 @ 0400.  AUC = 495.4 Vanc trough = 9.2  Height: 5\' 5"  (165.1 cm) Weight: 76.1 kg (167 lb 12.3 oz) IBW/kg (Calculated) : 57  Temp (24hrs), Avg:97.4 F (36.3 C), Min:97.3 F (36.3 C), Max:97.5 F (36.4 C)  Recent Labs  Lab 05/12/23 2011 05/12/23 2108 05/12/23 2224 05/10/2023 0010 05/22/2023 0237 05/15/2023 0254  WBC 0.9*  --   --   --   --   --   CREATININE  --  2.25*  --   --  1.81*  --   LATICACIDVEN  --   --  8.9* 7.8*  --  >9.0*    Estimated Creatinine Clearance: 37.9 mL/min (A) (by C-G formula based on SCr of 1.81 mg/dL (H)).    Allergies  Allergen Reactions   Sulfa Antibiotics Nausea And Vomiting    Antimicrobials this admission:   >>    >>   Dose adjustments this admission:   Microbiology results:  BCx:   UCx:    Sputum:    MRSA PCR:   Thank you for allowing pharmacy to be a part of this patient's care.  Maritta Kief D 05/30/2023 3:43 AM

## 2023-06-05 NOTE — Progress Notes (Signed)
 Pt was taken to CT on the Bipap, then to ICU 17 on the Bipap without incident. Pt remained on Bipap. Report was given to ICU RT.

## 2023-06-05 NOTE — Progress Notes (Signed)
 Per Dr Belia Heman Increased levophed 30 mcg/min due to patients blood pressure dropping. Continue to assess.

## 2023-06-05 DEATH — deceased
